# Patient Record
Sex: Male | Born: 2000 | Race: White | Hispanic: No | Marital: Single | Smoking: Never smoker
Health system: Southern US, Community
[De-identification: ages and names within clinical notes are randomized; demographics above are authoritative.]

## PROBLEM LIST (undated history)

## (undated) ENCOUNTER — Ambulatory Visit: Admission: EM | Payer: BC Managed Care – PPO | Source: Home / Self Care

## (undated) DIAGNOSIS — R519 Headache, unspecified: Secondary | ICD-10-CM

## (undated) HISTORY — PX: APPENDECTOMY: SHX54

## (undated) HISTORY — PX: TONSILLECTOMY: SUR1361

## (undated) HISTORY — PX: WISDOM TOOTH EXTRACTION: SHX21

## (undated) HISTORY — PX: SINOSCOPY: SHX187

---
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
## 2018-07-21 DIAGNOSIS — G43909 Migraine, unspecified, not intractable, without status migrainosus: Secondary | ICD-10-CM | POA: Insufficient documentation

## 2019-06-08 NOTE — Discharge Instructions (Signed)
[facility] °[facility] °ENDOSCOPIC SINUS SURGERY °[facility] ° °What is Functional Endoscopic Sinus Surgery? ° The Surgery involves making the natural openings of the sinuses larger by removing the bony partitions that separate the sinuses from the nasal cavity.  The natural sinus lining is preserved as much as possible to allow the sinuses to resume normal function after the surgery.  In some patients nasal polyps (excessively swollen lining of the sinuses) may be removed to relieve obstruction of the sinus openings.  The surgery is performed through the nose using lighted scopes, which eliminates the need for incisions on the face.  A septoplasty is a different procedure which is sometimes performed with sinus surgery.  It involves straightening the boy partition that separates the two sides of your nose.  A crooked or deviated septum may need repair if is obstructing the sinuses or nasal airflow.  Turbinate reduction is also often performed during sinus surgery.  The turbinates are bony proturberances from the side walls of the nose which swell and can obstruct the nose in patients with sinus and allergy problems.  Their size can be surgically reduced to help relieve nasal obstruction. ° °What Can Sinus Surgery Do For Me? ° Sinus surgery can reduce the frequency of sinus infections requiring antibiotic treatment.  This can provide improvement in nasal congestion, post-nasal drainage, facial pressure and nasal obstruction.  Surgery will NOT prevent you from ever having an infection again, so it usually only for patients who get infections 4 or more times yearly requiring antibiotics, or for infections that do not clear with antibiotics.  It will not cure nasal allergies, so patients with allergies may still require medication to treat their allergies after surgery. Surgery may improve headaches related to sinusitis, however, some people will continue to  require medication to control sinus headaches related to allergies.  Surgery will do nothing for other forms of headache (migraine, tension or cluster). ° °What Are the Risks of Endoscopic Sinus Surgery? ° Current techniques allow surgery to be performed safely with little risk, however, there are rare complications that patients should be aware of.  Because the sinuses are located around the eyes, there is risk of eye injury, including blindness, though again, this would be quite rare. This is usually a result of bleeding behind the eye during surgery, which puts the vision oat risk, though there are treatments to protect the vision and prevent permanent disrupted by surgery causing a leak of the spinal fluid that surrounds the brain.  More serious complications would include bleeding inside the brain cavity or damage to the brain.  Again, all of these complications are uncommon, and spinal fluid leaks can be safely managed surgically if they occur.  The most common complication of sinus surgery is bleeding from the nose, which may require packing or cauterization of the nose.  Continued sinus have polyps may experience recurrence of the polyps requiring revision surgery.  Alterations of sense of smell or injury to the tear ducts are also rare complications.  ° °What is the Surgery Like, and what is the Recovery? ° The Surgery usually takes a couple of hours to perform, and is usually performed under a general anesthetic (completely asleep).  Patients are usually discharged home after a couple of hours.  Sometimes during surgery it is necessary to pack the nose to control bleeding, and the packing is left in place for 24 - 48 hours, and removed by your surgeon.    If a septoplasty was performed during the procedure, there is often a splint placed which must be removed after 5-7 days.   °Discomfort: Pain is usually mild to moderate, and can be controlled by prescription pain medication or acetaminophen (Tylenol).   Aspirin, Ibuprofen (Advil, Motrin), or Naprosyn (Aleve) should be avoided, as they can cause increased bleeding.  Most patients feel sinus pressure like they have a bad head cold for several days.  Sleeping with your head elevated can help reduce swelling and facial pressure, as can ice packs over the face.  A humidifier may be helpful to keep the mucous and blood from drying in the nose.  ° °Diet: There are no specific diet restrictions, however, you should generally start with clear liquids and a light diet of bland foods because the anesthetic can cause some nausea.  Advance your diet depending on how your stomach feels.  Taking your pain medication with food will often help reduce stomach upset which pain medications can cause. ° °Nasal Saline Irrigation: It is important to remove blood clots and dried mucous from the nose as it is healing.  This is done by having you irrigate the nose at least 3 - 4 times daily with a salt water solution.  We recommend using NeilMed Sinus Rinse (available at the drug store).  Fill the squeeze bottle with the solution, bend over a sink, and insert the tip of the squeeze bottle into the nose ½ of an inch.  Point the tip of the squeeze bottle towards the inside corner of the eye on the same side your irrigating.  Squeeze the bottle and gently irrigate the nose.  If you bend forward as you do this, most of the fluid will flow back out of the nose, instead of down your throat.   The solution should be warm, near body temperature, when you irrigate.   Each time you irrigate, you should use a full squeeze bottle.  ° °Note that if you are instructed to use Nasal Steroid Sprays at any time after your surgery, irrigate with saline BEFORE using the steroid spray, so you do not wash it all out of the nose. °Another product, Nasal Saline Gel (such as AYR Nasal Saline Gel) can be applied in each nostril 3 - 4 times daily to moisture the nose and reduce scabbing or crusting. ° °Bleeding:   Bloody drainage from the nose can be expected for several days, and patients are instructed to irrigate their nose frequently with salt water to help remove mucous and blood clots.  The drainage may be dark red or brown, though some fresh blood may be seen intermittently, especially after irrigation.  Do not blow you nose, as bleeding may occur. If you must sneeze, keep your mouth open to allow air to escape through your mouth. ° °If heavy bleeding occurs: Irrigate the nose with saline to rinse out clots, then spray the nose 3 - 4 times with Afrin Nasal Decongestant Spray.  The spray will constrict the blood vessels to slow bleeding.  Pinch the lower half of your nose shut to apply pressure, and lay down with your head elevated.  Ice packs over the nose may help as well. If bleeding persists despite these measures, you should notify your doctor.  Do not use the Afrin routinely to control nasal congestion after surgery, as it can result in worsening congestion and may affect healing.  ° ° ° °Activity: Return to work varies among patients. Most patients will be   out of work at least 5 - 7 days to recover.  Patient may return to work after they are off of narcotic pain medication, and feeling well enough to perform the functions of their job.  Patients must avoid heavy lifting (over 10 pounds) or strenuous physical for 2 weeks after surgery, so your employer may need to assign you to light duty, or keep you out of work longer if light duty is not possible.  NOTE: you should not drive, operate dangerous machinery, do any mentally demanding tasks or make any important legal or financial decisions while on narcotic pain medication and recovering from the general anesthetic.  °  °Call Your Doctor Immediately if You Have Any of the Following: °1. Bleeding that you cannot control with the above measures °2. Loss of vision, double vision, bulging of the eye or black eyes. °3. Fever over 101 degrees °4. Neck stiffness with  severe headache, fever, nausea and change in mental state. °You are always encourage to call anytime with concerns, however, please call with requests for pain medication refills during office hours. ° °Office Endoscopy: During follow-up visits your doctor will remove any packing or splints that may have been placed and evaluate and clean your sinuses endoscopically.  Topical anesthetic will be used to make this as comfortable as possible, though you may want to take your pain medication prior to the visit.  How often this will need to be done varies from patient to patient.  After complete recovery from the surgery, you may need follow-up endoscopy from time to time, particularly if there is concern of recurrent infection or nasal polyps. ° ° °General Anesthesia, Adult, Care After °This sheet gives you information about how to care for yourself after your procedure. Your health care provider may also give you more specific instructions. If you have problems or questions, contact your health care provider. °What can I expect after the procedure? °After the procedure, the following side effects are common: °· Pain or discomfort at the IV site. °· Nausea. °· Vomiting. °· Sore throat. °· Trouble concentrating. °· Feeling cold or chills. °· Weak or tired. °· Sleepiness and fatigue. °· Soreness and body aches. These side effects can affect parts of the body that were not involved in surgery. °Follow these instructions at home: ° °For at least 24 hours after the procedure: °· Have a responsible adult stay with you. It is important to have someone help care for you until you are awake and alert. °· Rest as needed. °· Do not: °? Participate in activities in which you could fall or become injured. °? Drive. °? Use heavy machinery. °? Drink alcohol. °? Take sleeping pills or medicines that cause drowsiness. °? Make important decisions or sign legal documents. °? Take care of children on your own. °Eating and  drinking °· Follow any instructions from your health care provider about eating or drinking restrictions. °· When you feel hungry, start by eating small amounts of foods that are soft and easy to digest (bland), such as toast. Gradually return to your regular diet. °· Drink enough fluid to keep your urine pale yellow. °· If you vomit, rehydrate by drinking water, juice, or clear broth. °General instructions °· If you have sleep apnea, surgery and certain medicines can increase your risk for breathing problems. Follow instructions from your health care provider about wearing your sleep device: °? Anytime you are sleeping, including during daytime naps. °? While taking prescription pain medicines, sleeping medicines, or medicines   that make you drowsy. °· Return to your normal activities as told by your health care provider. Ask your health care provider what activities are safe for you. °· Take over-the-counter and prescription medicines only as told by your health care provider. °· If you smoke, do not smoke without supervision. °· Keep all follow-up visits as told by your health care provider. This is important. °Contact a health care provider if: °· You have nausea or vomiting that does not get better with medicine. °· You cannot eat or drink without vomiting. °· You have pain that does not get better with medicine. °· You are unable to pass urine. °· You develop a skin rash. °· You have a fever. °· You have redness around your IV site that gets worse. °Get help right away if: °· You have difficulty breathing. °· You have chest pain. °· You have blood in your urine or stool, or you vomit blood. °Summary °· After the procedure, it is common to have a sore throat or nausea. It is also common to feel tired. °· Have a responsible adult stay with you for the first 24 hours after general anesthesia. It is important to have someone help care for you until you are awake and alert. °· When you feel hungry, start by eating  small amounts of foods that are soft and easy to digest (bland), such as toast. Gradually return to your regular diet. °· Drink enough fluid to keep your urine pale yellow. °· Return to your normal activities as told by your health care provider. Ask your health care provider what activities are safe for you. °This information is not intended to replace advice given to you by your health care provider. Make sure you discuss any questions you have with your health care provider. °Document Released: 02/08/2001 Document Revised: 11/05/2017 Document Reviewed: 06/18/2017 °Elsevier Patient Education © 2020 [employer] ° °

## 2019-06-12 ENCOUNTER — Other Ambulatory Visit
Admission: RE | Admit: 2019-06-12 | Discharge: 2019-06-12 | Disposition: A | Discharge status: Home / Self Care | Payer: BC Managed Care – PPO | Source: Ambulatory Visit | Attending: Otolaryngology | Admitting: Otolaryngology

## 2019-06-12 ENCOUNTER — Other Ambulatory Visit: Payer: Self-pay

## 2019-06-12 DIAGNOSIS — Z20828 Contact with and (suspected) exposure to other viral communicable diseases: Secondary | ICD-10-CM | POA: Insufficient documentation

## 2019-06-12 LAB — SARS CORONAVIRUS 2 (TAT 6-24 HRS): SARS Coronavirus 2: NEGATIVE

## 2019-06-13 ENCOUNTER — Other Ambulatory Visit: Payer: Self-pay

## 2019-06-13 ENCOUNTER — Encounter: Payer: Self-pay | Admitting: *Deleted

## 2019-06-14 NOTE — Anesthesia Preprocedure Evaluation (Addendum)
Anesthesia Evaluation  Patient identified by MRN, date of birth, ID band Patient awake    Reviewed: Allergy & Precautions, NPO status , Patient's Chart, lab work & pertinent test results  History of Anesthesia Complications Negative for: history of anesthetic complications  Airway Mallampati: I   Neck ROM: Full    Dental no notable dental hx.    Pulmonary neg pulmonary ROS,    Pulmonary exam normal breath sounds clear to auscultation       Cardiovascular Exercise Tolerance: Good negative cardio ROS Normal cardiovascular exam Rhythm:Regular Rate:Normal     Neuro/Psych  Headaches,    GI/Hepatic negative GI ROS, Neg liver ROS,   Endo/Other  negative endocrine ROS  Renal/GU negative Renal ROS     Musculoskeletal   Abdominal   Peds  Hematology negative hematology ROS (+)   Anesthesia Other Findings   Reproductive/Obstetrics                            Anesthesia Physical Anesthesia Plan  ASA: I  Anesthesia Plan: General   Post-op Pain Management:    Induction: Intravenous  PONV Risk Score and Plan: 2 and Dexamethasone and Ondansetron  Airway Management Planned: Oral ETT  Additional Equipment:   Intra-op Plan:   Post-operative Plan: Extubation in OR  Informed Consent: I have reviewed the patients History and Physical, chart, labs and discussed the procedure including the risks, benefits and alternatives for the proposed anesthesia with the patient or authorized representative who has indicated his/her understanding and acceptance.       Plan Discussed with: CRNA  Anesthesia Plan Comments:        Anesthesia Quick Evaluation

## 2019-06-15 ENCOUNTER — Ambulatory Visit
Admission: RE | Admit: 2019-06-15 | Discharge: 2019-06-15 | Disposition: A | Discharge status: Home / Self Care | Payer: BC Managed Care – PPO | Attending: Otolaryngology | Admitting: Otolaryngology

## 2019-06-15 ENCOUNTER — Ambulatory Visit: Payer: BC Managed Care – PPO | Admitting: Anesthesiology

## 2019-06-15 ENCOUNTER — Encounter
Admission: RE | Disposition: A | Discharge status: Home / Self Care | Payer: Self-pay | Source: Home / Self Care | Attending: Otolaryngology

## 2019-06-15 DIAGNOSIS — J3489 Other specified disorders of nose and nasal sinuses: Secondary | ICD-10-CM | POA: Insufficient documentation

## 2019-06-15 DIAGNOSIS — J343 Hypertrophy of nasal turbinates: Secondary | ICD-10-CM | POA: Insufficient documentation

## 2019-06-15 DIAGNOSIS — J342 Deviated nasal septum: Secondary | ICD-10-CM | POA: Diagnosis present

## 2019-06-15 HISTORY — PX: NASAL SEPTOPLASTY W/ TURBINOPLASTY: SHX2070

## 2019-06-15 HISTORY — PX: ENDOSCOPIC CONCHA BULLOSA RESECTION: SHX6395

## 2019-06-15 HISTORY — DX: Headache, unspecified: R51.9

## 2019-06-15 SURGERY — SEPTOPLASTY, NOSE, WITH NASAL TURBINATE REDUCTION
Anesthesia: General | Site: Nose | Laterality: Bilateral

## 2019-06-15 MED ORDER — LIDOCAINE HCL (CARDIAC) PF 100 MG/5ML IV SOSY
PREFILLED_SYRINGE | INTRAVENOUS | Status: DC | PRN
  Administered 2019-06-15: 40 mg via INTRAVENOUS

## 2019-06-15 MED ORDER — LACTATED RINGERS IV SOLN
INTRAVENOUS | Status: DC
  Administered 2019-06-15: 09:00:00 via INTRAVENOUS

## 2019-06-15 MED ORDER — GLYCOPYRROLATE 0.2 MG/ML IJ SOLN
INTRAMUSCULAR | Status: DC | PRN
  Administered 2019-06-15: 0.1 mg via INTRAVENOUS

## 2019-06-15 MED ORDER — SUCCINYLCHOLINE CHLORIDE 20 MG/ML IJ SOLN
INTRAMUSCULAR | Status: DC | PRN
  Administered 2019-06-15: 100 mg via INTRAVENOUS

## 2019-06-15 MED ORDER — LIDOCAINE-EPINEPHRINE 1 %-1:100000 IJ SOLN
INTRAMUSCULAR | Status: DC | PRN
  Administered 2019-06-15: 6 mL

## 2019-06-15 MED ORDER — OXYCODONE HCL 5 MG/5ML PO SOLN: 5.0000 mg | Freq: Once | ORAL | Status: AC | PRN

## 2019-06-15 MED ORDER — DEXTROSE 5 % IV SOLN: 2000.0000 mg | Freq: Once | INTRAVENOUS | Status: DC

## 2019-06-15 MED ORDER — PREDNISONE 10 MG PO TABS: ORAL_TABLET | ORAL | 0 refills | Status: DC

## 2019-06-15 MED ORDER — HYDROCODONE-ACETAMINOPHEN 5-325 MG PO TABS: 1.0000 | ORAL_TABLET | Freq: Four times a day (QID) | ORAL | 0 refills | Status: AC | PRN

## 2019-06-15 MED ORDER — ACETAMINOPHEN 10 MG/ML IV SOLN
1000.0000 mg | Freq: Once | INTRAVENOUS | Status: AC
  Administered 2019-06-15: 09:00:00 1000 mg via INTRAVENOUS

## 2019-06-15 MED ORDER — PROPOFOL 10 MG/ML IV BOLUS
INTRAVENOUS | Status: DC | PRN
  Administered 2019-06-15: 150 mg via INTRAVENOUS

## 2019-06-15 MED ORDER — FENTANYL CITRATE (PF) 100 MCG/2ML IJ SOLN
INTRAMUSCULAR | Status: DC | PRN
  Administered 2019-06-15: 100 ug via INTRAVENOUS

## 2019-06-15 MED ORDER — MIDAZOLAM HCL 5 MG/5ML IJ SOLN
INTRAMUSCULAR | Status: DC | PRN
  Administered 2019-06-15: 2 mg via INTRAVENOUS

## 2019-06-15 MED ORDER — CEPHALEXIN 500 MG PO CAPS: 500.0000 mg | ORAL_CAPSULE | Freq: Two times a day (BID) | ORAL | 0 refills | Status: DC

## 2019-06-15 MED ORDER — DEXAMETHASONE SODIUM PHOSPHATE 4 MG/ML IJ SOLN
INTRAMUSCULAR | Status: DC | PRN
  Administered 2019-06-15: 10 mg via INTRAVENOUS

## 2019-06-15 MED ORDER — PHENYLEPHRINE HCL 0.5 % NA SOLN
NASAL | Status: DC | PRN
  Administered 2019-06-15: 30 mL via TOPICAL

## 2019-06-15 MED ORDER — OXYMETAZOLINE HCL 0.05 % NA SOLN
2.0000 | Freq: Once | NASAL | Status: AC
  Administered 2019-06-15: 2 via NASAL

## 2019-06-15 MED ORDER — OXYCODONE HCL 5 MG PO TABS
5.0000 mg | ORAL_TABLET | Freq: Once | ORAL | Status: AC | PRN
  Administered 2019-06-15: 5 mg via ORAL

## 2019-06-15 MED ORDER — ONDANSETRON HCL 4 MG/2ML IJ SOLN
INTRAMUSCULAR | Status: DC | PRN
  Administered 2019-06-15: 4 mg via INTRAVENOUS

## 2019-06-15 MED ORDER — FENTANYL CITRATE (PF) 100 MCG/2ML IJ SOLN
25.0000 ug | INTRAMUSCULAR | Status: DC | PRN
  Administered 2019-06-15: 25 ug via INTRAVENOUS

## 2019-06-15 MED ORDER — ONDANSETRON HCL 4 MG/2ML IJ SOLN: 4.0000 mg | Freq: Once | INTRAMUSCULAR | Status: DC | PRN

## 2019-06-15 MED ORDER — LACTATED RINGERS IV SOLN: INTRAVENOUS | Status: DC

## 2019-06-15 SURGICAL SUPPLY — 32 items
CANISTER SUCT 1200ML W/VALVE (MISCELLANEOUS) ×3 IMPLANT
CATH IV 18X1 1/4 SAFELET (CATHETERS) ×3 IMPLANT
COAGULATOR SUCT 8FR VV (MISCELLANEOUS) ×3 IMPLANT
DRAPE HEAD BAR (DRAPES) ×3 IMPLANT
ELECT REM PT RETURN 9FT ADLT (ELECTROSURGICAL) ×3
ELECTRODE REM PT RTRN 9FT ADLT (ELECTROSURGICAL) ×1 IMPLANT
GLOVE PI ULTRA LF STRL 7.5 (GLOVE) ×2 IMPLANT
GLOVE PI ULTRA NON LATEX 7.5 (GLOVE) ×6
GOWN STRL REUS W/ TWL LRG LVL3 (GOWN DISPOSABLE) ×1 IMPLANT
GOWN STRL REUS W/TWL LRG LVL3 (GOWN DISPOSABLE) ×2
IV CATH 18X1 1/4 SAFELET (CATHETERS) ×1
KIT TURNOVER KIT A (KITS) ×3 IMPLANT
NDL ANESTHESIA 27G X 3.5 (NEEDLE) ×1 IMPLANT
NDL HYPO 27GX1-1/4 (NEEDLE) ×1 IMPLANT
NEEDLE ANESTHESIA  27G X 3.5 (NEEDLE) ×2
NEEDLE ANESTHESIA 27G X 3.5 (NEEDLE) ×1 IMPLANT
NEEDLE HYPO 27GX1-1/4 (NEEDLE) ×3 IMPLANT
PACK ENT CUSTOM (PACKS) ×3 IMPLANT
PACKING NASAL EPIS 4X2.4 XEROG (MISCELLANEOUS) ×2 IMPLANT
PATTIES SURGICAL .5 X3 (DISPOSABLE) ×3 IMPLANT
SOL ANTI-FOG 6CC FOG-OUT (MISCELLANEOUS) ×1 IMPLANT
SOL FOG-OUT ANTI-FOG 6CC (MISCELLANEOUS) ×2
SPLINT NASAL SEPTAL BLV .50 ST (MISCELLANEOUS) ×3 IMPLANT
STRAP BODY AND KNEE 60X3 (MISCELLANEOUS) ×6 IMPLANT
SUT CHROMIC 3-0 (SUTURE) ×2
SUT CHROMIC 3-0 KS 27XMFL CR (SUTURE) ×1
SUT ETHILON 3-0 KS 30 BLK (SUTURE) ×3 IMPLANT
SUT PLAIN GUT 4-0 (SUTURE) ×3 IMPLANT
SUTURE CHRMC 3-0 KS 27XMFL CR (SUTURE) ×1 IMPLANT
SYR 3ML LL SCALE MARK (SYRINGE) ×3 IMPLANT
TOWEL OR 17X26 4PK STRL BLUE (TOWEL DISPOSABLE) ×3 IMPLANT
WATER STERILE IRR 250ML POUR (IV SOLUTION) ×3 IMPLANT

## 2019-06-15 NOTE — H&P (Signed)
H&P has been reviewed and patient reevaluated, no changes necessary. To be downloaded later.  

## 2019-06-15 NOTE — Transfer of Care (Signed)
Immediate Anesthesia Transfer of Care Note  Patient: Melvin Kelly  Procedure(s) Performed: NASAL SEPTOPLASTY WITH INFERIOR TURBINATE REDUCTION (Bilateral Nose) ENDOSCOPIC CONCHA BULLOSA RESECTION (Bilateral Nose)  Patient Location: PACU  Anesthesia Type: General  Level of Consciousness: awake, alert  and patient cooperative  Airway and Oxygen Therapy: Patient Spontanous Breathing and Patient connected to supplemental oxygen  Post-op Assessment: Post-op Vital signs reviewed, Patient's Cardiovascular Status Stable, Respiratory Function Stable, Patent Airway and No signs of Nausea or vomiting  Post-op Vital Signs: Reviewed and stable  Complications: No apparent anesthesia complications

## 2019-06-15 NOTE — Anesthesia Procedure Notes (Signed)
Procedure Name: Intubation Date/Time: 06/15/2019 10:00 AM Performed by: Mayme Genta, CRNA Pre-anesthesia Checklist: Patient identified, Emergency Drugs available, Suction available, Patient being monitored and Timeout performed Patient Re-evaluated:Patient Re-evaluated prior to induction Oxygen Delivery Method: Circle system utilized Preoxygenation: Pre-oxygenation with 100% oxygen Induction Type: IV induction Ventilation: Mask ventilation without difficulty Laryngoscope Size: Miller and 3 Grade View: Grade I Tube type: Oral Rae Tube size: 7.5 mm Number of attempts: 1 Placement Confirmation: ETT inserted through vocal cords under direct vision,  positive ETCO2 and breath sounds checked- equal and bilateral Tube secured with: Tape Dental Injury: Teeth and Oropharynx as per pre-operative assessment

## 2019-06-15 NOTE — Anesthesia Postprocedure Evaluation (Signed)
Anesthesia Post Note  Patient: Melvin Kelly  Procedure(s) Performed: NASAL SEPTOPLASTY WITH INFERIOR TURBINATE REDUCTION (Bilateral Nose) ENDOSCOPIC CONCHA BULLOSA RESECTION (Bilateral Nose)  Patient location during evaluation: PACU Anesthesia Type: General Level of consciousness: awake and alert, oriented and patient cooperative Pain management: pain level controlled Vital Signs Assessment: post-procedure vital signs reviewed and stable Respiratory status: spontaneous breathing, nonlabored ventilation and respiratory function stable Cardiovascular status: blood pressure returned to baseline and stable Postop Assessment: adequate PO intake Anesthetic complications: no    Darrin Nipper

## 2019-06-15 NOTE — Op Note (Signed)
06/15/2019  11:10 AM  [id number]   Pre-Op Dx:  Deviated Nasal Septum, Hypertrophic Inferior Turbinates, concha bullosa of middle turbinates  Post-op Dx: Same  Proc: Nasal Septoplasty, Bilateral Partial Reduction Inferior Turbinates, endoscopic trimming of the concha bullosa of the middle turbinates  Surg:  Elon Alas Rema Lievanos  Anes:  GOT  EBL: 50 mL  Comp: None  Findings: Septum mostly deviated to the right side.  Enlarged inferior turbinates.  Concha bullosa superiorly on both sides of the middle turbinates    Procedure: With the patient in a comfortable supine position,  general orotracheal anesthesia was induced without difficulty.     The patient received preoperative Afrin spray for topical decongestion and vasoconstriction.  Intravenous prophylactic antibiotics were administered.  At an appropriate level, the patient was placed in a semi-sitting position.  Nasal vibrissae were trimmed.   1% Xylocaine with 1:100,000 epinephrine, 6 cc's, was infiltrated into the anterior floor of the nose, into the nasal spine region, into the membranous columella, and finally into the submucoperichondrial plane of the septum on both sides.  Several minutes were allowed for this to take effect.  Cottoniod pledgetts soaked in Afrin and 4% Xylocaine were placed into both nasal cavities and left while the patient was prepped and draped in the standard fashion.  The materials were removed from the nose and observed to be intact and correct in number.  The nose was inspected with a headlight and zero degree scope with the findings as described above.  A left Killian incision was sharply executed and carried down to the quadrangular cartilage. The mucoperichondrium was elelvated along the quadrangular plate back to the bony-cartilaginous junction. The mucoperiostium was then elevated along the ethmoid plate and the vomer. The boney-catilaginous junction was then split with a freer elevator and the  mucoperiosteum was elevated on the opposite side. The mucoperiosteum was then elevated along the maxillary crest as needed to expose the crooked bone of the crest.  Boney spurs of the vomer and maxillary crest were removed with Donavan Foil forceps.  The cartilaginous plate was trimmed along its posterior and inferior borders of about 2 mm of cartilage to free it up inferiorly. Some of the deviated ethmoid plate was then fractured and removed with Takahashi forceps to free up the posterior border of the quadrangular plate and allow it to swing back to the midline. The mucosal flaps were placed back into their anatomic position to allow visualization of the airways. The septum now sat in the midline with an improved airway.  A 3-0 Chromic suture on a Keith needle in used to anchor the inferior septum at the nasal spine with a through and through suture. The mucosal flaps are then sutured together using a through and through whip stitch of 4-0 Plain Gut with a mini-Keith needle. This was used to close the Hydro incision as well.   The inferior turbinates were then inspected. An incision was created along the inferior aspect of the left inferior turbinate with removal of some of the inferior soft tissue and bone. Electrocautery was used to control bleeding in the area. The remaining turbinate was then outfractured to open up the airway further. There was no significant bleeding noted. The right turbinate was then trimmed and outfractured in a similar fashion.  0 degrees scope was used to visualize the left middle meatus.  The middle turbinate was infractured.  There was attachment medially the sinuses where the concha bullosa was near the midportion of the middle turbinate.  Using a freer elevator this was opened and using 45 degree through biting forceps the medial wall of this was trimmed and opened.  Bleeding was controlled with a cotton pledget soaked in phenylephrine and Xylocaine.  The right side was then  visualized with the 0 degrees scope.  The middle turbinate was infractured again and the concha bullosa was found using a Therapist, nutritionalreer elevator.  Small through biting forceps were used to widen this and removed the lateral wall of the concha bullosa.  Cottonoid pledgets were placed here again for vasoconstriction.  On the left side the cottonoid pledget was removed and the opening into the concha bullosa could be now seen.  The middle meatus was more open as well.  A xerogel piece was placed into the middle meatus to cover over the opening to prevent scabbing and scarring here.  This was soaked with water to liquefy it.  The right side was then visualized and the cottonoid pledget removed.  The open the concha bullosa could be seen.  Xerogel was placed here and wetted as well to liquefy it.  The airways were then visualized and showed open passageways on both sides that were significantly improved compared to before surgery. There was no signifcant bleeding. Nasal splints were applied to both sides of the septum using Xomed 0.355mm regular sized splints that were trimmed, and then held in position with a 3-0 Nylon through and through suture.  The patient was turned back over to anesthesia, and awakened, extubated, and taken to the PACU in satisfactory condition.  Dispo:   PACU to home  Plan: Ice, elevation, narcotic analgesia, steroid taper, and prophylactic antibiotics for the duration of indwelling nasal foreign bodies.  We will reevaluate the patient in the office in 6 days and remove the septal splints.  Return to work in 10 days, strenuous activities in two weeks.   Beverly Sessionsaul H Maceo Hernan 06/15/2019 11:10 AM

## 2019-06-16 ENCOUNTER — Encounter: Payer: Self-pay | Admitting: Otolaryngology

## 2020-04-14 ENCOUNTER — Other Ambulatory Visit: Payer: Self-pay

## 2020-04-14 ENCOUNTER — Encounter: Payer: Self-pay | Admitting: Emergency Medicine

## 2020-04-14 ENCOUNTER — Telehealth: Payer: Self-pay | Admitting: Urgent Care

## 2020-04-14 ENCOUNTER — Ambulatory Visit
Admit: 2020-04-14 | Discharge: 2020-04-14 | Disposition: A | Discharge status: Home / Self Care | Payer: BC Managed Care – PPO | Attending: Urgent Care | Admitting: Urgent Care

## 2020-04-14 ENCOUNTER — Ambulatory Visit
Admission: EM | Admit: 2020-04-14 | Discharge: 2020-04-14 | Disposition: A | Discharge status: Home / Self Care | Payer: BC Managed Care – PPO | Attending: Urgent Care | Admitting: Urgent Care

## 2020-04-14 DIAGNOSIS — N50812 Left testicular pain: Secondary | ICD-10-CM

## 2020-04-14 DIAGNOSIS — Z0189 Encounter for other specified special examinations: Secondary | ICD-10-CM

## 2020-04-14 DIAGNOSIS — Z113 Encounter for screening for infections with a predominantly sexual mode of transmission: Secondary | ICD-10-CM | POA: Diagnosis present

## 2020-04-14 LAB — URINALYSIS, COMPLETE (UACMP) WITH MICROSCOPIC
Bacteria, UA: NONE SEEN
Bilirubin Urine: NEGATIVE
Glucose, UA: NEGATIVE mg/dL
Hgb urine dipstick: NEGATIVE
Ketones, ur: NEGATIVE mg/dL
Leukocytes,Ua: NEGATIVE
Nitrite: NEGATIVE
Protein, ur: NEGATIVE mg/dL
Specific Gravity, Urine: 1.02 (ref 1.005–1.030)
Squamous Epithelial / HPF: NONE SEEN (ref 0–5)
pH: 7 (ref 5.0–8.0)

## 2020-04-14 LAB — CHLAMYDIA/NGC RT PCR (ARMC ONLY)
Chlamydia Tr: NOT DETECTED
N gonorrhoeae: NOT DETECTED

## 2020-04-14 NOTE — Telephone Encounter (Signed)
    Date: 04/14/2020  Name: Melvin Kelly DOB: 01/16/2001 MRN: [id number]  Re: [place] results and continuing POC  Patient contacted by me to review US imaging results. Questions fielded. Discussed essentially negative imaging and incidental finding of a 4 mm LEFT "scrotal pearl". I have spoken with urology today and they have recommended having patient seen in the office. Patient amenable. Referral entered. Urology practice to contact patient to schedule. Patient appreciative on communication. Reassurance provided.   Orders Placed This Encounter  Procedures  . Ambulatory referral to Urology    Referral Priority:   Routine    Referral Type:   Consultation    Referral Reason:   Specialty Services Required    Referred to Provider:   Sondra Come, MD    Requested Specialty:   Urology    Number of Visits Requested:   1   Quentin Mulling, MSN, APRN, FNP-C, CEN Advanced Practice Provider [facility] 04/14/2020 4:21 PM

## 2020-04-14 NOTE — Discharge Instructions (Addendum)
Lab testing pending. Will call you if anything results as positive.   Go to [facility] now for an ultrasound. You will check in at the medical mall (enter by the big statue). You can leave after the exam and I will call you with the results and plans.   Quentin Mulling, MSN, APRN, FNP-C, CEN Advanced Practice Provider [facility] 04/14/2020 1:56 PM

## 2020-04-14 NOTE — ED Provider Notes (Signed)
[place]   Name: Melvin Kelly DOB: 07-Nov-2001 MRN: [id number] CSN: [id number] PCP: Patient, No Pcp Per  Arrival date and time:  04/14/20 1321  Chief Complaint:  Testicle Pain (left)  NOTE: Prior to seeing the patient today, I have reviewed the triage nursing documentation and vital signs. Clinical staff has updated patient's PMH/PSHx, current medication list, and drug allergies/intolerances to ensure comprehensive history available to assist in medical decision making.   History:   HPI: Melvin Kelly is a 19 y.o. male who presents today with complaints of pain in his LEFT testicle that has been intermittently present for the last 4-6 months. Patient notes that the pain worsened about 2 days ago. He describes the pain as a "pressure" or "pulling" sensation behind his testicle. He denies any scrotal pain or swelling. No urinary symptoms; no dysuria, frequency, or urgency. He has not had any pain in his penis, bleeding from his penis, or penile discharge. Patient states, "the pain is worse when my testicles are jostled". He reports that 2 days ago he stepped down from a higher elevation causing his scrotum to "flop around", which in turn caused significant pain in the testicle. Patient reporting a bi-directional urinary stream. No gross hematuria. He reports being able to palpate something attached to the testicle. Patient reporting that has sexual activity was "last year", at which time he endorses unprotected sexual intercourse with a single partner. Patient was concerned with possibility of STI, which prompted him to be seen at the health department where he had RPR and HIV testing performed; results negative. Patient advising that "due to COVID" the health department would not perform gonorrhea or chlamydia testing. He reports that he has had gonorrhea and chlamydia testing in the past at [facility], however he believes it was before his current symptoms started; records not  available for review at the time of his clinic visit today.    Past Medical History:  Diagnosis Date  . Headache    sinus(?) - 3-4x/week    Past Surgical History:  Procedure Laterality Date  . APPENDECTOMY     age 18  . 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 NASAL SEPTOPLASTY W/ TURBINOPLASTY Bilateral 06/15/2019   Procedure: NASAL SEPTOPLASTY WITH INFERIOR TURBINATE REDUCTION;  Surgeon: [name], MD;  Location: [facility];  Service: ENT;  Laterality: Bilateral;  . WISDOM TOOTH EXTRACTION     age 64    Family History  Problem Relation Age of Onset  . Healthy Mother   . Healthy Father     Social History   Tobacco Use  . Smoking status: Never Smoker  . Smokeless tobacco: Never Used  Substance Use Topics  . Alcohol use: No  . Drug use: Never    There are no problems to display for this patient.   Home Medications:    Current Meds  Medication Sig  . triamcinolone (NASACORT ALLERGY 24HR) 55 MCG/ACT AERO nasal inhaler Place 2 sprays into the nose daily.    Allergies:   Patient has no known allergies.  Review of Systems (ROS):  Review of systems NEGATIVE unless otherwise noted in narrative H&P section.   Vital Signs: Today's Vitals   04/14/20 1335 04/14/20 1337 04/14/20 1400  BP:  129/65   Pulse:  71   Resp:  15   Temp:  98.1 F (  36.7 C)   TempSrc:  Oral   SpO2:  100%   Weight: 150 lb (68 kg)    Height: 6\' 3"  (1.905 m)    PainSc: 5   5     Physical Exam: Physical Exam  Constitutional: He is oriented to person, place, and time and well-developed, well-nourished, and in no distress.  HENT:  Head: Normocephalic and atraumatic.  Eyes: Pupils are equal, round, and reactive to light.  Cardiovascular: Normal rate, regular rhythm, normal heart sounds and intact distal pulses.    Pulmonary/Chest: Effort normal and breath sounds normal.  Abdominal: Soft. Normal appearance and bowel sounds are normal. He exhibits no distension. There is no abdominal tenderness.  Genitourinary: He exhibits testicular tenderness (LEFT). He exhibits no epididymal tenderness. Abnormal scrotal mass: small extra-testicular area of firmness/mass to LEFT scrotum; moveable and adjacent to testicle; non-tender. Cremasteric reflex is present. Penis exhibits no lesions and no edema. No discharge found.    Genitourinary Comments: (-) blue dot and Phren's signs. Small extra-testicular area of firmness/mass to LEFT scrotum; moveable and adjacent to testicle; non-tender   Neurological: He is alert and oriented to person, place, and time. Gait normal.  Skin: Skin is warm and dry. No rash noted. He is not diaphoretic.  Psychiatric: Memory, affect and judgment normal. His mood appears anxious.  Nursing note and vitals reviewed.   Urgent Care Treatments / Results:   Orders Placed This Encounter  Procedures  . Chlamydia/NGC rt PCR ([facility] only)  . SCROTUM W/DOPPLER  . Urinalysis, Complete w Microscopic    LABS: PLEASE NOTE: all labs that were ordered this encounter are listed, however only abnormal results are displayed. Labs Reviewed  URINALYSIS, COMPLETE (UACMP) WITH MICROSCOPIC - Abnormal; Notable for the following components:      Result Value   Color, Urine STRAW (*)    All other components within normal limits  CHLAMYDIA/NGC RT PCR [facility] ONLY)    EKG: -None  RADIOLOGY: [facility] SCROTUM W/DOPPLER  Result Date: 04/14/2020 CLINICAL DATA:  19 year old male with left sided testicular pain. EXAM: SCROTAL ULTRASOUND DOPPLER ULTRASOUND OF THE TESTICLES TECHNIQUE: Complete ultrasound examination of the testicles, epididymis, and other scrotal structures was performed. Color and spectral Doppler ultrasound were also utilized to evaluate blood flow to the testicles. COMPARISON:  None. FINDINGS:  Right testicle Measurements: 5.6 x 2.3 x 3.5 cm. No mass or microlithiasis visualized. Left testicle Measurements: 5.3 x 2.2 x 3.2 cm. No mass or microlithiasis visualized. Right epididymis:  Normal in size and appearance. Left epididymis:  Normal in size and appearance. Hydrocele:  None visualized. Varicocele:  None visualized.  There is a 4 mm left scrotal pearl. Pulsed Doppler interrogation of both testes demonstrates normal low resistance arterial and venous waveforms bilaterally. IMPRESSION: A 4 mm left scrotal pearl, otherwise unremarkable testicular ultrasound. Electronically Signed   By: 26 M.D.   On: 04/14/2020 15:16    PROCEDURES: Procedures  MEDICATIONS RECEIVED THIS VISIT: Medications - No data to display  PERTINENT CLINICAL COURSE NOTES/UPDATES:   Initial Impression / Assessment and Plan / Urgent Care Course:  Pertinent labs & imaging results that were available during my care of the patient were personally reviewed by me and considered in my medical decision making (see lab/imaging section of note for values and interpretations).  Melvin Kelly is a 19 y.o. male who presents to [facility] today with complaints of Testicle Pain (left)  Patient is well appearing overall in clinic today. He does not  appear to be in any acute distress. Presenting symptoms (see HPI) and exam as documented above. Exam reveals LEFT testicular tenderness. No immediate concern for torsion given the chronicity of symptoms and (-) blue dot sign. Suspect more benign etiology such as hydrocele, varicocele, or epididymitis. UA today negative for infection; 0-5 WBC/hpf, 0-5 RBC/hpf, and no nitrites, LE, or bacteria. Sending random urine for GC. Given chronicity of symptoms and exam patient needs further evaluation (imaging). Discussed with patient that US imaging not available in the UC setting today, however in efforts to prevent a visit to the emergency department, I would be willing to order  the study as an outpatient at the hospital. Patient willing to travel to the hospital for the study today; order placed and [place] department notified. Will plan on calling patient with the results once exam completed and interpreted by the radiologist today. Patient verbalizes understanding and consent with the POC as discussed with him.   ADDENDUM: [place] of scrotum/testicles reviewed. Study was remarkable for a 4 mm LEFT scrotal pearl only. There were no other acute findings. Discussed presentation and US findings with Dr. Jeffie Pollock (urology) via [facility]. MD advised that this sonographic finding should not be causing the pain that the patient is experiencing. MD recommended having patient seen in consult by urology this week. Referral entered. Patient notified of results; questions fielded. He was advised to expect call from urology practice to schedule appointment for him to be seen by urology specialist.   Final Clinical Impressions / Urgent Care Diagnoses:   Final diagnoses:  Left testicular pain  Encounter for urine test  Screen for STD (sexually transmitted disease)    New Prescriptions:  [place] Controlled Substance Registry consulted? Not Applicable  No orders of the defined types were placed in this encounter.   Recommended Follow up Care:  Patient encouraged to follow up with the following provider within the specified time frame, or sooner as dictated by the severity of his symptoms. As always, he was instructed that for any urgent/emergent care needs, he should seek care either here or in the emergency department for more immediate evaluation.  Follow-up Information    Go to  [facility] [facility].   Why: Scheduled you for an outpatient ultrasound today. Leave urgent care and proceed to [facility] for ultrasound.       Abbie Sons, MD.   Specialty: Urology Why: Need to be seen for further evaluation. They should call you to schedule an appointment. Contact information: [address] [place] [address] [phone]         NOTE: This note was prepared using Dragon dictation software along with smaller phrase technology. Despite my best ability to proofread, there is the potential that transcriptional errors may still occur from this process, and are completely unintentional.    Karen Kitchens, NP 04/14/20 1617

## 2020-04-14 NOTE — ED Triage Notes (Signed)
Patient c/o left testicle pain that couple of days ago.  Patient denies redness or swelling.  Patient denies injury.  Patient denies penile discharge.

## 2020-04-23 ENCOUNTER — Other Ambulatory Visit: Payer: Self-pay

## 2020-04-23 ENCOUNTER — Ambulatory Visit (INDEPENDENT_AMBULATORY_CARE_PROVIDER_SITE_OTHER): Payer: BC Managed Care – PPO | Admitting: Urology

## 2020-04-23 ENCOUNTER — Encounter: Payer: Self-pay | Admitting: Urology

## 2020-04-23 VITALS — BP 123/79 | HR 62 | Ht 74.0 in | Wt 155.0 lb

## 2020-04-23 DIAGNOSIS — N5082 Scrotal pain: Secondary | ICD-10-CM | POA: Diagnosis not present

## 2020-04-23 MED ORDER — CELECOXIB 100 MG PO CAPS: 100.0000 mg | ORAL_CAPSULE | Freq: Every day | ORAL | 0 refills | Status: DC

## 2020-04-23 NOTE — Progress Notes (Signed)
   04/23/20 8:12 AM   [name] 31-Jan-2001 [id number]  CC: Scrotal pain  HPI: I saw [name] in urology clinic today for evaluation of left scrotal pain.  He is a healthy 19 year old male who reports 6 months of intermittent left-sided scrotal pain.  He denies any gross hematuria, history of UTI, or history of STD.  He was seen recently in the urgent care on 04/14/2020 where urinalysis was completely benign, STD testing was negative, and scrotal ultrasound showed no abnormalities.  He reports occasional split urinary stream but denies any other urinary symptoms.  He has not been sexually active in the last year.  His scrotal pain is worse with strenuous activity or jumping.   PMH: Past Medical History:  Diagnosis Date  . Headache    sinus(?) - 3-4x/week    Surgical History: Past Surgical History:  Procedure Laterality Date  . APPENDECTOMY     age 67  . ENDOSCOPIC CONCHA BULLOSA RESECTION Bilateral 06/15/2019   Procedure: ENDOSCOPIC CONCHA BULLOSA RESECTION;  Surgeon: [name], MD;  Location: [facility];  Service: ENT;  Laterality: Bilateral;  . NASAL SEPTOPLASTY W/ TURBINOPLASTY Bilateral 06/15/2019   Procedure: NASAL SEPTOPLASTY WITH INFERIOR TURBINATE REDUCTION;  Surgeon: [name], MD;  Location: [facility];  Service: ENT;  Laterality: Bilateral;  . WISDOM TOOTH EXTRACTION     age 66    Family History: Family History  Problem Relation Age of Onset  . Healthy Mother   . Healthy Father     Social History:  reports that he has never smoked. He has never used smokeless tobacco. He reports that he does not drink alcohol or use drugs.  Physical Exam: There were no vitals taken for this visit.   Constitutional:  Alert and oriented, No acute distress. Cardiovascular: No clubbing, cyanosis, or edema. Respiratory: Normal respiratory effort, no increased work of breathing. GI: Abdomen is soft, nontender, nondistended, no abdominal masses GU:  Circumcised phallus with widely patent meatus, testicles 25 cc and descended bilaterally without masses.  No testicular tenderness on exam.  Laboratory Data: Reviewed, see HPI  Pertinent Imaging: I have personally reviewed the scrotal ultrasound.  No evidence of testicular mass, cysts, or hyperemia worrisome for epididymitis.  Incidental finding of a 4 mm left scrotal pearl.  Assessment & Plan:   In summary, he is a healthy 19 year old male with 6 months of intermittent left scrotal plain with no clear etiology.  Urinalysis, STD testing, and scrotal ultrasound are all negative.  We discussed strategies for scrotal pain including snug fitting underwear, NSAIDs, icing, and rest.  We discussed return precautions at length including worsening urinary symptoms, gross hematuria, or worsening scrotal pain.  Celebrex 100 mg daily x2 weeks, consider amitriptyline at follow-up if persistent scrotal pain Behavioral strategies discussed RTC 1 month for symptom check  [name], MD 04/23/2020  [facility] [address], [address] [place], [place] [address] [phone]

## 2020-04-23 NOTE — Patient Instructions (Signed)
1.  Wear snug fitting underwear, especially with strenuous activity 2. Okay to ice scrotum as needed for moderate to severe pain for 15 to 20 minutes at a time  Pelvic Pain, Male Pelvic pain is pain in your lower abdomen, below your belly button and between your hips. The pain may start suddenly (be acute), keep coming back (recur), or last a long time (become chronic). Pelvic pain that lasts longer than six months is considered chronic. There are many possible causes of pelvic pain. Sometimes, the cause is not known. Pelvic pain may affect your:  Prostate gland.  Urinary system.  Digestive tract.  Musculoskeletal system. Strained muscles or ligaments may cause pelvic pain. Follow these instructions at home:  Medicines  Take over-the-counter and prescription medicines only as told by your health care provider.  If you were prescribed an antibiotic medicine, take it as told by your health care provider. Do not stop taking the antibiotic even if you start to feel better. Managing pain, stiffness, and swelling   Take warm water baths (sitz baths). Sitz baths help with relaxing your pelvic floor muscles. ? For a sitz bath, the water only comes up to your hips and covers your buttocks. A sitz bath may done at home in a bathtub or with a portable sitz bath that fits over the toilet.  If directed, apply heat to the affected area before you exercise. Use the heat source that your health care provider recommends, such as a moist heat pack or a heating pad. ? Place a towel between your skin and the heat source. ? Leave the heat on for 20-30 minutes. ? Remove the heat if your skin turns bright red. This is especially important if you are unable to feel pain, heat, or cold. You may have a greater risk of getting burned. General instructions  Rest as told by your health care provider.  Keep a journal of your pelvic pain. Write down: ? When the pain started. ? Where the pain is  located. ? What seems to make the pain better or worse. ? Any symptoms you have along with the pain.  Follow your treatment plan as told by your health care provider. This may include: ? Pelvic physical therapy. ? Yoga, meditation, and exercise. ? Biofeedback. This process trains you to manage your body's response (physiological response) through breathing techniques and relaxation methods. You will work with a therapist while machines are used to monitor your physical symptoms. ? Acupuncture. This is a type of treatment that involves stimulating specific points on your body by inserting thin needles through your skin to treat pain.  Keep all follow-up visits as told by your health care provider. This is important. Contact a health care provider if:  Medicine does not help your pain.  Your pain comes back.  You have new symptoms.  You have a fever or chills.  You are constipated.  You have blood in your urine or stool.  You feel weak or light-headed. Get help right away if:  You have sudden severe pain.  Your pain steadily gets worse.  You have severe pain along with fever, nausea, vomiting, or excessive sweating. Summary  Pelvic pain is pain in your lower abdomen, below your belly button and between your hips. There are many possible causes of pelvic pain. Sometimes, the cause is not known.  Take over-the-counter and prescription medicines only as told by your health care provider. If you were prescribed an antibiotic medicine, take it as told  by your health care provider. Do not stop taking the antibiotic even if you start to feel better.  Contact a health care provider if you have new or worsening symptoms.  Get help right away if you have severe pain along with fever, nausea, vomiting, or excessive sweating.  Keep all follow-up visits as told by your health care provider. This is important. This information is not intended to replace advice given to you by your health  care provider. Make sure you discuss any questions you have with your health care provider. Document Revised: 03/23/2018 Document Reviewed: 03/23/2018 Elsevier Patient Education  2020 [employer].

## 2020-05-21 ENCOUNTER — Ambulatory Visit: Payer: BC Managed Care – PPO | Admitting: Urology

## 2020-11-12 ENCOUNTER — Other Ambulatory Visit: Payer: Self-pay

## 2020-11-12 ENCOUNTER — Ambulatory Visit
Admission: EM | Admit: 2020-11-12 | Discharge: 2020-11-12 | Disposition: A | Discharge status: Home / Self Care | Payer: BC Managed Care – PPO | Attending: Emergency Medicine | Admitting: Emergency Medicine

## 2020-11-12 DIAGNOSIS — Z113 Encounter for screening for infections with a predominantly sexual mode of transmission: Secondary | ICD-10-CM

## 2020-11-12 DIAGNOSIS — R300 Dysuria: Secondary | ICD-10-CM | POA: Diagnosis not present

## 2020-11-12 LAB — URINALYSIS, COMPLETE (UACMP) WITH MICROSCOPIC
Bilirubin Urine: NEGATIVE
Glucose, UA: NEGATIVE mg/dL
Hgb urine dipstick: NEGATIVE
Ketones, ur: NEGATIVE mg/dL
Leukocytes,Ua: NEGATIVE
Nitrite: NEGATIVE
Protein, ur: NEGATIVE mg/dL
RBC / HPF: NONE SEEN RBC/hpf (ref 0–5)
Specific Gravity, Urine: 1.015 (ref 1.005–1.030)
Squamous Epithelial / HPF: NONE SEEN (ref 0–5)
pH: 7.5 (ref 5.0–8.0)

## 2020-11-12 LAB — CHLAMYDIA/NGC RT PCR (ARMC ONLY)
Chlamydia Tr: DETECTED — AB
N gonorrhoeae: NOT DETECTED

## 2020-11-12 MED ORDER — DOXYCYCLINE HYCLATE 100 MG PO CAPS: 100.0000 mg | ORAL_CAPSULE | Freq: Two times a day (BID) | ORAL | 0 refills | Status: AC

## 2020-11-12 MED ORDER — CEFTRIAXONE SODIUM 500 MG IJ SOLR
500.0000 mg | Freq: Once | INTRAMUSCULAR | Status: AC
  Administered 2020-11-12: 500 mg via INTRAMUSCULAR

## 2020-11-12 NOTE — ED Triage Notes (Signed)
Pt c/o burning with urination, frequency, lower abdominal pain for approx 1 week. Also reports small amount penile discharge onset today.  Also reports n/d and c/o right mid back pain. States he had u/s of testicles in June.  Denies hematuria, fever, vomiting.

## 2020-11-12 NOTE — Discharge Instructions (Addendum)
Take the doxycycline twice daily for 7 days.  Take this medication with food.  Doxycycline will make you more prone to sunburn.  Please make sure you are wearing sunscreen when outdoors or exposed to sunlight.  Your urinalysis today did not reveal the presence of an infection.  Your gonorrhea and Chlamydia swab are still pending.  We will call you when those results are back.  If your symptoms persist, or new symptoms develop, either return for reevaluation or follow-up with your primary care provider.

## 2020-11-12 NOTE — ED Provider Notes (Signed)
[facility]    CSN: [id number] Arrival date & time: 11/12/20  1008      History   Chief Complaint Chief Complaint  Patient presents with  . Dysuria  . Penile Discharge    HPI Melvin Kelly is a 19 y.o. male.   HPI   19 year old male here for evaluation of painful urination and penile discharge.  Patient reports that he is been experiencing foul-smelling urine for the past 2 months and pain with urination intermittently for the past week.  Patient also has urgency and frequency of urination, left low back pain, fatigue, and he developed a yellow penile discharge today.  Patient is sexually active his last sexual encounter was in November it was unprotected and with a male partner.  Patient denies blood in his urine, fever, rashes, or pain in his testicles.  Patient states that he has had some mild lower abdominal pain.  Patient is currently taking prep that he started in July.  His last HIV test was in February and he was negative at that time.  Past Medical History:  Diagnosis Date  . Headache    sinus(?) - 3-4x/week    There are no problems to display for this patient.   Past Surgical History:  Procedure Laterality Date  . APPENDECTOMY     age 49  . 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 NASAL SEPTOPLASTY W/ TURBINOPLASTY Bilateral 06/15/2019   Procedure: NASAL SEPTOPLASTY WITH INFERIOR TURBINATE REDUCTION;  Surgeon: [name], MD;  Location: [facility];  Service: ENT;  Laterality: Bilateral;  . WISDOM TOOTH EXTRACTION     age 19       Home Medications    Prior to Admission medications   Medication Sig Start Date End Date Taking? Authorizing Provider  citalopram (CELEXA) 10 MG tablet Take 10 mg by mouth daily. 10/21/20  Yes [provider]  doxycycline  (VIBRAMYCIN) 100 MG capsule Take 1 capsule (100 mg total) by mouth 2 (two) times daily for 7 days. 11/12/20 11/19/20 Yes Becky Augusta, NP  emtricitabine-tenofovir (TRUVADA) 200-300 MG tablet  10/24/20  Yes [provider]  celecoxib (CELEBREX) 100 MG capsule Take 1 capsule (100 mg total) by mouth daily. 04/23/20   Sondra Come, MD  triamcinolone (NASACORT ALLERGY 24HR) 55 MCG/ACT AERO nasal inhaler Place 2 sprays into the nose daily.    [provider]    Family History Family History  Problem Relation Age of Onset  . Healthy Mother   . Healthy Father     Social History Social History   Tobacco Use  . Smoking status: Never Smoker  . Smokeless tobacco: Never Used  Vaping Use  . Vaping Use: Never used  Substance Use Topics  . Alcohol use: No  . Drug use: Never     Allergies   Patient has no known allergies.   Review of Systems Review of Systems  Constitutional: Negative for activity change, appetite change and fever.  Gastrointestinal: Positive for abdominal pain. Negative for diarrhea, nausea and vomiting.  Genitourinary: Positive for dysuria, frequency, penile discharge and urgency. Negative for hematuria, penile pain, penile swelling and testicular pain.  Musculoskeletal: Positive for back pain.  Skin: Negative for rash.  Hematological: Negative.   Psychiatric/Behavioral: Negative.      Physical Exam Triage Vital Signs ED Triage Vitals [11/12/20 1244]  Enc Vitals Group     BP      Pulse      Resp      Temp      Temp src      SpO2      Weight      Height      Head Circumference      Peak Flow      Pain Score 0     Pain Loc      Pain Edu?      Excl. in GC?    No data found.  Updated Vital Signs BP (!) 124/101 (BP Location: Right Arm)   Pulse 74   Temp 98.2 F (36.8 C) (Oral)   Resp 18   SpO2 100%   Visual Acuity Right Eye Distance:   Left Eye Distance:   Bilateral Distance:    Right Eye Near:   Left Eye Near:    Bilateral  Near:     Physical Exam Vitals and nursing note reviewed.  Constitutional:      General: He is not in acute distress.    Appearance: Normal appearance. He is normal weight. He is not ill-appearing.  HENT:     Head: Normocephalic and atraumatic.  Cardiovascular:     Rate and Rhythm: Normal rate and regular rhythm.     Pulses: Normal pulses.     Heart sounds: Normal heart sounds. No murmur heard. No gallop.   Pulmonary:     Effort: Pulmonary effort is normal.     Breath sounds: Normal breath sounds. No wheezing, rhonchi or rales.  Abdominal:     General: Abdomen is flat. Bowel sounds are normal.     Palpations: Abdomen is soft.     Tenderness: There is abdominal tenderness. There is no right CVA tenderness, left CVA tenderness, guarding or rebound.  Skin:    General: Skin is warm and dry.     Capillary Refill: Capillary refill takes less than 2 seconds.     Findings: No erythema or rash.  Neurological:     General: No focal deficit present.     Mental Status: He is alert and oriented to person, place, and time.  Psychiatric:        Mood and Affect: Mood normal.        Behavior: Behavior normal.        Thought Content: Thought content normal.        Judgment: Judgment normal.      UC Treatments / Results  Labs (all labs ordered are listed, but only abnormal results are displayed) Labs Reviewed  URINALYSIS, COMPLETE (UACMP) WITH MICROSCOPIC - Abnormal; Notable for the following components:      Result Value   Bacteria, UA FEW (*)    All other components within normal limits  CHLAMYDIA/NGC RT PCR [facility] ONLY)    EKG   Radiology No results found.  Procedures Procedures (including critical care time)  Medications Ordered in UC Medications  cefTRIAXone (ROCEPHIN) injection 500 mg (has no administration in time range)    Initial Impression / Assessment and Plan / UC Course  I have reviewed the triage vital signs and the nursing notes.  Pertinent labs &  imaging results that were available during my care of the patient were reviewed by me and considered in my medical decision making (see chart for details).   Nontoxic-appearing gentleman here for evaluation of painful urination and penile discharge.  His painful urination is been on and off  for the past week and his penile discharge started today.  He describes as being yellow in color.  Patient also reports he has had some foul-smelling urine for the past 2 months.  Additionally, patient has some generalized lower abdominal pain.  Physical exam reveals an abdomen that is soft, nondistended, with bowel sounds in all 4 quadrants.  Patient has mild nonfocal lower abdominal tenderness.  No guarding or rebound on exam.  Genital exam deferred.  Patient has no CVA tenderness.  Urine and swab collected for GC chlamydia and also urinalysis.  Urinalysis is positive for few bacteria.  There are no leukocytes, nitrates, WBCs, or protein in the urine.  Gonorrhea chlamydia screen is pending.  We will treat for presumptive STI with 500 Rocephin IM here and 7 days of doxycycline p.o.  This will cover gonorrhea and chlamydia.  Final Clinical Impressions(s) / UC Diagnoses   Final diagnoses:  Dysuria  Routine screening for STI (sexually transmitted infection)     Discharge Instructions     Take the doxycycline twice daily for 7 days.  Take this medication with food.  Doxycycline will make you more prone to sunburn.  Please make sure you are wearing sunscreen when outdoors or exposed to sunlight.  Your urinalysis today did not reveal the presence of an infection.  Your gonorrhea and Chlamydia swab are still pending.  We will call you when those results are back.  If your symptoms persist, or new symptoms develop, either return for reevaluation or follow-up with your primary care provider.    ED Prescriptions    Medication Sig Dispense Auth. Provider   doxycycline (VIBRAMYCIN) 100 MG capsule Take 1 capsule  (100 mg total) by mouth 2 (two) times daily for 7 days. 14 capsule Becky Augusta, NP     PDMP not reviewed this encounter.   Becky Augusta, NP 11/12/20 1335

## 2020-11-21 DIAGNOSIS — Z20822 Contact with and (suspected) exposure to covid-19: Secondary | ICD-10-CM | POA: Diagnosis not present

## 2020-11-21 DIAGNOSIS — U071 COVID-19: Secondary | ICD-10-CM | POA: Diagnosis not present

## 2021-02-21 DIAGNOSIS — J301 Allergic rhinitis due to pollen: Secondary | ICD-10-CM | POA: Diagnosis not present

## 2021-02-21 DIAGNOSIS — J358 Other chronic diseases of tonsils and adenoids: Secondary | ICD-10-CM | POA: Diagnosis not present

## 2021-03-13 ENCOUNTER — Encounter: Payer: Self-pay | Admitting: Otolaryngology

## 2021-03-17 DIAGNOSIS — J358 Other chronic diseases of tonsils and adenoids: Secondary | ICD-10-CM | POA: Diagnosis not present

## 2021-03-24 NOTE — Discharge Instructions (Signed)
T & A INSTRUCTION SHEET - [facility]  PAUL JUENGEL, MD  [address] [place], [place] [address] [place].  [phone]  INFORMATION SHEET FOR A TONSILLECTOMY AND ADENDOIDECTOMY  About Your Tonsils and Adenoids  The tonsils and adenoids are normal body tissues that are part of our immune system.  They normally help to protect us against diseases that may enter our mouth and nose. However, sometimes the tonsils and/or adenoids become too large and obstruct our breathing, especially at night.    If either of these things happen it helps to remove the tonsils and adenoids in order to become healthier. The operation to remove the tonsils and adenoids is called a tonsillectomy and adenoidectomy.  The Location of Your Tonsils and Adenoids  The tonsils are located in the back of the throat on both side and sit in a cradle of muscles. The adenoids are located in the roof of the mouth, behind the nose, and closely associated with the opening of the Eustachian tube to the ear.  Surgery on Tonsils and Adenoids  A tonsillectomy and adenoidectomy is a short operation which takes about thirty minutes.  This includes being put to sleep and being awakened. Tonsillectomies and adenoidectomies are performed at [facility] and may require observation period in the recovery room prior to going home. Children are required to remain in recovery for at least 45 minutes.   Following the Operation for a Tonsillectomy  A cautery machine is used to control bleeding. Bleeding from a tonsillectomy and adenoidectomy is minimal and postoperatively the risk of bleeding is approximately four percent, although this rarely life threatening.  After your tonsillectomy and adenoidectomy post-op care at home: 1. Our patients are able to go home the same day. You may be given prescriptions for pain medications, if indicated. 2. It is extremely important to  remember that fluid intake is of utmost importance after a tonsillectomy. The amount that you drink must be maintained in the postoperative period. A good indication of whether a child is getting enough fluid is whether his/her urine output is constant. As long as children are urinating or wetting their diaper every 6 - 8 hours this is usually enough fluid intake.   3. Although rare, this is a risk of some bleeding in the first ten days after surgery. This usually occurs between day five and nine postoperatively. This risk of bleeding is approximately four percent. If you or your child should have any bleeding you should remain calm and notify our office or go directly to the emergency room at [facility] where they will contact us. Our doctors are available seven days a week for notification. We recommend sitting up quietly in a chair, place an ice pack on the front of the neck and spitting out the blood gently until we are able to contact you. Adults should gargle gently with ice water and this may help stop the bleeding. If the bleeding does not stop after a short time, i.e. 10 to 15 minutes, or seems to be increasing again, please contact us or go to the hospital.   4. It is common for the pain to be worse at 5 - 7 days postoperatively. This occurs because the "scab" is peeling off and the mucous membrane (skin of the throat) is growing back where the tonsils were.   5. It is common for a low-grade fever, less than 102, during the first week   after a tonsillectomy and adenoidectomy. It is usually due to not drinking enough liquids, and we suggest your use liquid Tylenol (acetaminophen) or the pain medicine with Tylenol (acetaminophen) prescribed in order to keep your temperature below 102. Please follow the directions on the back of the bottle. 6. Recommendations for post-operative pain in children and adults: a) For Children 12 and younger: Recommendations are for oral Tylenol  (acetaminophen) and oral Motrin (ibuprofen). Administer the Tylenol (acetaminophen) and Motrin as stated on bottle for patient's age/weight. Sometimes it may be necessary to alternate the Tylenol (acetaminophen) and Motrin for improved pain control. Motrin (ibuprofen) does last slightly longer so many patients benefit from being given this prior to bedtime. All children should avoid Aspirin products for 2 weeks following surgery. b) For children over the age of 12: Tylenol (acetaminophen) is the preferred first choice for pain control. Depending on your child's size, sometimes they will be given a combination of Tylenol (acetaminophen) and hydrocodone medication or sometimes it will be recommended they take Motrin (ibuprofen) in addition to the Tylenol (acetaminophen). Narcotics should always be used with caution in children following surgery as they can suppress their breathing and switching to over the counter Tylenol (acetaminophen) and Motrin (ibuprofen) as soon as possible is recommended. All patients should avoid Aspirin products for 2 weeks following surgery. c) Adults: Usually adults will require a narcotic pain medication following a tonsillectomy. This usually has either hydrocodone or oxycodone in it and can usually be taken every 4 to 6 hours as needed for moderate pain. If the medication does not have Tylenol (acetaminophen) in it, you may also supplement Tylenol (acetaminophen) as needed every 4 to 6 hours for breakthrough or mild pain. Adults should avoid Aspirin, Aleve, Motrin, and Ibuprofen products for 2 weeks following surgery as they can increase your risk of bleeding. 7. If you happen to look in the mirror or into your child's mouth you will see white/gray patches on the back of the throat. This is what a scab looks like in the mouth and is normal after having a tonsillectomy and adenoidectomy. They will disappear once the tonsil areas heal completely. However, it may cause a noticeable odor,  and this too will disappear with time.     8. You or your child may experience ear pain after having a tonsillectomy and adenoidectomy.  This is called referred pain and comes from the throat, but it is felt in the ears.  Ear pain is quite common and expected. It will usually go away after ten days. There is usually nothing wrong with the ears, and it is primarily due to the healing area stimulating the nerve to the ear that runs along the side of the throat. Use either the prescribed pain medicine or Tylenol (acetaminophen) as needed.  9. The throat tissues after a tonsillectomy are obviously sensitive. Smoking around children who have had a tonsillectomy significantly increases the risk of bleeding. DO NOT SMOKE!  What to Expect Each Day  First Day at Home 1. Patients will be discharged home the same day.  2. Drink at least four glasses of liquid a day. Clear, cool liquids are recommended. Fruit juices containing citric acid are not recommended because they tend to cause pain. Carbonated beverages are allowed if you pour them from glass to glass to remove the bubbles as these tend to cause discomfort. Avoid alcoholic beverages.  3. Eat very soft foods such as soups, broth, jello, custard, pudding, ice cream, popsicles, applesauce, mashed potatoes,   and in general anything that you can crush between your tongue and the roof of your mouth. Try adding [employer] Mix into your food for extra calories. It is not uncommon to lose 5 to 10 pounds of fluid weight. The weight will be gained back quickly once you're feeling better and drinking more.  4. Sleep with your head elevated on two pillows for about three days to help decrease the swelling.  5. DO NOT SMOKE!  Day Two  1. Rest as much as possible. Use common sense in your activities.  2. Continue drinking at least four glasses of liquid per day.  3. Follow the soft diet.  4. Use your pain medication as needed.  Day Three  1. Advance  your activity as you are able and continue to follow the previous day's suggestions.  Days Four Through Six  1. Advance your diet and begin to eat more solid foods such as chopped hamburger. 2. Advance your activities slowly. Children should be kept mostly around the house.  3. Not uncommonly, there will be more pain at this time. It is temporary, usually lasting a day or two.  Day Seven Through Ten  1. Most individuals by this time are able to return to work or school unless otherwise instructed. Consider sending children back to school for a half day on the first day back.  General Anesthesia, Adult, Care After This sheet gives you information about how to care for yourself after your procedure. Your health care provider may also give you more specific instructions. If you have problems or questions, contact your health care provider. What can I expect after the procedure? After the procedure, the following side effects are common:  Pain or discomfort at the IV site.  Nausea.  Vomiting.  Sore throat.  Trouble concentrating.  Feeling cold or chills.  Feeling weak or tired.  Sleepiness and fatigue.  Soreness and body aches. These side effects can affect parts of the body that were not involved in surgery. Follow these instructions at home: For the time period you were told by your health care provider:  Rest.  Do not participate in activities where you could fall or become injured.  Do not drive or use machinery.  Do not drink alcohol.  Do not take sleeping pills or medicines that cause drowsiness.  Do not make important decisions or sign legal documents.  Do not take care of children on your own.   Eating and drinking  Follow any instructions from your health care provider about eating or drinking restrictions.  When you feel hungry, start by eating small amounts of foods that are soft and easy to digest (bland), such as toast. Gradually return to your regular  diet.  Drink enough fluid to keep your urine pale yellow.  If you vomit, rehydrate by drinking water, juice, or clear broth. General instructions  If you have sleep apnea, surgery and certain medicines can increase your risk for breathing problems. Follow instructions from your health care provider about wearing your sleep device: ? Anytime you are sleeping, including during daytime naps. ? While taking prescription pain medicines, sleeping medicines, or medicines that make you drowsy.  Have a responsible adult stay with you for the time you are told. It is important to have someone help care for you until you are awake and alert.  Return to your normal activities as told by your health care provider. Ask your health care provider what activities are safe for you.  Take over-the-counter and prescription medicines only as told by your health care provider.  If you smoke, do not smoke without supervision.  Keep all follow-up visits as told by your health care provider. This is important. Contact a health care provider if:  You have nausea or vomiting that does not get better with medicine.  You cannot eat or drink without vomiting.  You have pain that does not get better with medicine.  You are unable to pass urine.  You develop a skin rash.  You have a fever.  You have redness around your IV site that gets worse. Get help right away if:  You have difficulty breathing.  You have chest pain.  You have blood in your urine or stool, or you vomit blood. Summary  After the procedure, it is common to have a sore throat or nausea. It is also common to feel tired.  Have a responsible adult stay with you for the time you are told. It is important to have someone help care for you until you are awake and alert.  When you feel hungry, start by eating small amounts of foods that are soft and easy to digest (bland), such as toast. Gradually return to your regular diet.  Drink  enough fluid to keep your urine pale yellow.  Return to your normal activities as told by your health care provider. Ask your health care provider what activities are safe for you. This information is not intended to replace advice given to you by your health care provider. Make sure you discuss any questions you have with your health care provider. Document Revised: 07/18/2020 Document Reviewed: 02/15/2020 Elsevier Patient Education  2021 [employer].

## 2021-03-25 DIAGNOSIS — B354 Tinea corporis: Secondary | ICD-10-CM | POA: Diagnosis not present

## 2021-03-25 DIAGNOSIS — F419 Anxiety disorder, unspecified: Secondary | ICD-10-CM | POA: Diagnosis not present

## 2021-03-27 ENCOUNTER — Encounter
Admission: RE | Disposition: A | Discharge status: Home / Self Care | Payer: Self-pay | Source: Home / Self Care | Attending: Otolaryngology

## 2021-03-27 ENCOUNTER — Ambulatory Visit: Payer: BC Managed Care – PPO | Admitting: Anesthesiology

## 2021-03-27 ENCOUNTER — Other Ambulatory Visit: Payer: Self-pay

## 2021-03-27 ENCOUNTER — Ambulatory Visit
Admission: RE | Admit: 2021-03-27 | Discharge: 2021-03-27 | Disposition: A | Discharge status: Home / Self Care | Payer: BC Managed Care – PPO | Attending: Otolaryngology | Admitting: Otolaryngology

## 2021-03-27 ENCOUNTER — Encounter: Payer: Self-pay | Admitting: Otolaryngology

## 2021-03-27 DIAGNOSIS — Z79899 Other long term (current) drug therapy: Secondary | ICD-10-CM | POA: Insufficient documentation

## 2021-03-27 DIAGNOSIS — J351 Hypertrophy of tonsils: Secondary | ICD-10-CM | POA: Diagnosis not present

## 2021-03-27 DIAGNOSIS — J3501 Chronic tonsillitis: Secondary | ICD-10-CM | POA: Diagnosis not present

## 2021-03-27 DIAGNOSIS — J358 Other chronic diseases of tonsils and adenoids: Secondary | ICD-10-CM | POA: Diagnosis not present

## 2021-03-27 HISTORY — PX: TONSILLECTOMY: SHX5217

## 2021-03-27 SURGERY — TONSILLECTOMY
Anesthesia: General | Site: Throat | Laterality: Bilateral

## 2021-03-27 MED ORDER — MIDAZOLAM HCL 5 MG/5ML IJ SOLN
INTRAMUSCULAR | Status: DC | PRN
  Administered 2021-03-27: 2 mg via INTRAVENOUS

## 2021-03-27 MED ORDER — HYDROCODONE-ACETAMINOPHEN 7.5-325 MG/15ML PO SOLN: 15.0000 mL | ORAL | 0 refills | Status: DC | PRN

## 2021-03-27 MED ORDER — FENTANYL CITRATE (PF) 100 MCG/2ML IJ SOLN
25.0000 ug | INTRAMUSCULAR | Status: DC | PRN
  Administered 2021-03-27 (×2): 25 ug via INTRAVENOUS
  Administered 2021-03-27: 50 ug via INTRAVENOUS

## 2021-03-27 MED ORDER — PROPOFOL 10 MG/ML IV BOLUS
INTRAVENOUS | Status: DC | PRN
  Administered 2021-03-27: 150 mg via INTRAVENOUS

## 2021-03-27 MED ORDER — SCOPOLAMINE 1 MG/3DAYS TD PT72
1.0000 | MEDICATED_PATCH | TRANSDERMAL | Status: DC
  Administered 2021-03-27: 1.5 mg via TRANSDERMAL

## 2021-03-27 MED ORDER — OXYCODONE HCL 5 MG PO TABS
5.0000 mg | ORAL_TABLET | Freq: Once | ORAL | Status: AC | PRN
  Administered 2021-03-27: 5 mg via ORAL

## 2021-03-27 MED ORDER — ONDANSETRON HCL 4 MG/2ML IJ SOLN
4.0000 mg | Freq: Once | INTRAMUSCULAR | Status: AC | PRN
  Administered 2021-03-27: 4 mg via INTRAVENOUS

## 2021-03-27 MED ORDER — DEXMEDETOMIDINE HCL 200 MCG/2ML IV SOLN
INTRAVENOUS | Status: DC | PRN
  Administered 2021-03-27: 5 ug via INTRAVENOUS
  Administered 2021-03-27: 15 ug via INTRAVENOUS

## 2021-03-27 MED ORDER — DEXAMETHASONE SODIUM PHOSPHATE 4 MG/ML IJ SOLN
INTRAMUSCULAR | Status: DC | PRN
  Administered 2021-03-27: 10 mg via INTRAVENOUS

## 2021-03-27 MED ORDER — LACTATED RINGERS IV SOLN: INTRAVENOUS | Status: DC | PRN

## 2021-03-27 MED ORDER — ACETAMINOPHEN 10 MG/ML IV SOLN
1000.0000 mg | Freq: Once | INTRAVENOUS | Status: AC
  Administered 2021-03-27: 1000 mg via INTRAVENOUS

## 2021-03-27 MED ORDER — OXYCODONE HCL 5 MG/5ML PO SOLN
5.0000 mg | Freq: Once | ORAL | Status: AC | PRN
Start: 2021-03-27 — End: 2021-03-27

## 2021-03-27 MED ORDER — LIDOCAINE HCL (CARDIAC) PF 100 MG/5ML IV SOSY
PREFILLED_SYRINGE | INTRAVENOUS | Status: DC | PRN
  Administered 2021-03-27: 50 mg via INTRAVENOUS

## 2021-03-27 MED ORDER — SUCCINYLCHOLINE CHLORIDE 20 MG/ML IJ SOLN
INTRAMUSCULAR | Status: DC | PRN
  Administered 2021-03-27: 100 mg via INTRAVENOUS

## 2021-03-27 MED ORDER — GLYCOPYRROLATE 0.2 MG/ML IJ SOLN
INTRAMUSCULAR | Status: DC | PRN
  Administered 2021-03-27: .1 mg via INTRAVENOUS

## 2021-03-27 SURGICAL SUPPLY — 13 items
BLADE BOVIE TIP EXT 4 (BLADE) ×2 IMPLANT
CANISTER SUCT 1200ML W/VALVE (MISCELLANEOUS) ×2 IMPLANT
ELECT REM PT RETURN 9FT ADLT (ELECTROSURGICAL) ×2
ELECTRODE REM PT RTRN 9FT ADLT (ELECTROSURGICAL) ×1 IMPLANT
GLOVE PI ULTRA LF STRL 7.5 (GLOVE) ×1 IMPLANT
GLOVE PI ULTRA NON LATEX 7.5 (GLOVE) ×1
KIT TURNOVER KIT A (KITS) ×2 IMPLANT
PACK TONSIL AND ADENOID CUSTOM (PACKS) ×2 IMPLANT
PENCIL SMOKE EVACUATOR (MISCELLANEOUS) ×2 IMPLANT
SLEEVE SUCTION 125 (MISCELLANEOUS) ×2 IMPLANT
SOL ANTI-FOG 6CC FOG-OUT (MISCELLANEOUS) ×1 IMPLANT
SOL FOG-OUT ANTI-FOG 6CC (MISCELLANEOUS) ×1
STRAP BODY AND KNEE 60X3 (MISCELLANEOUS) ×2 IMPLANT

## 2021-03-27 NOTE — Anesthesia Postprocedure Evaluation (Signed)
Anesthesia Post Note  Patient: Melvin Kelly  Procedure(s) Performed: TONSILLECTOMY POSSIBLE ADENOID (Bilateral Throat)     Patient location during evaluation: PACU Anesthesia Type: General Level of consciousness: awake and alert and oriented Pain management: satisfactory to patient Vital Signs Assessment: post-procedure vital signs reviewed and stable Respiratory status: spontaneous breathing, nonlabored ventilation and respiratory function stable Cardiovascular status: blood pressure returned to baseline and stable Postop Assessment: Adequate PO intake and No signs of nausea or vomiting Anesthetic complications: no   No complications documented.  Cherly Beach

## 2021-03-27 NOTE — Anesthesia Procedure Notes (Signed)
Procedure Name: Intubation Date/Time: 03/27/2021 7:38 AM Performed by: Jimmy Picket, CRNA Pre-anesthesia Checklist: Patient identified, Emergency Drugs available, Suction available, Patient being monitored and Timeout performed Patient Re-evaluated:Patient Re-evaluated prior to induction Oxygen Delivery Method: Circle system utilized Preoxygenation: Pre-oxygenation with 100% oxygen Induction Type: IV induction Ventilation: Mask ventilation without difficulty Laryngoscope Size: Miller and 3 Grade View: Grade I Tube type: Oral Rae Tube size: 8.0 mm Number of attempts: 1 Placement Confirmation: ETT inserted through vocal cords under direct vision,  positive ETCO2 and breath sounds checked- equal and bilateral Tube secured with: Tape Dental Injury: Teeth and Oropharynx as per pre-operative assessment

## 2021-03-27 NOTE — Transfer of Care (Signed)
Immediate Anesthesia Transfer of Care Note  Patient: Melvin Kelly  Procedure(s) Performed: TONSILLECTOMY POSSIBLE ADENOID (Bilateral Throat)  Patient Location: PACU  Anesthesia Type: General ETT  Level of Consciousness: awake, alert  and patient cooperative  Airway and Oxygen Therapy: Patient Spontanous Breathing and Patient connected to supplemental oxygen  Post-op Assessment: Post-op Vital signs reviewed, Patient's Cardiovascular Status Stable, Respiratory Function Stable, Patent Airway and No signs of Nausea or vomiting  Post-op Vital Signs: Reviewed and stable  Complications: No complications documented.

## 2021-03-27 NOTE — Anesthesia Preprocedure Evaluation (Signed)
Anesthesia Evaluation  Patient identified by MRN, date of birth, ID band Patient awake    Reviewed: Allergy & Precautions, H&P , NPO status , Patient's Chart, lab work & pertinent test results  Airway Mallampati: II  TM Distance: >3 FB Neck ROM: full    Dental no notable dental hx.    Pulmonary    Pulmonary exam normal breath sounds clear to auscultation       Cardiovascular Normal cardiovascular exam Rhythm:regular Rate:Normal     Neuro/Psych    GI/Hepatic   Endo/Other    Renal/GU      Musculoskeletal   Abdominal   Peds  Hematology   Anesthesia Other Findings   Reproductive/Obstetrics                             Anesthesia Physical Anesthesia Plan  ASA: I  Anesthesia Plan: General ETT   Post-op Pain Management:    Induction:   PONV Risk Score and Plan: 2 and Treatment may vary due to age or medical condition, Scopolamine patch - Pre-op, Ondansetron and Dexamethasone  Airway Management Planned:   Additional Equipment:   Intra-op Plan:   Post-operative Plan:   Informed Consent: I have reviewed the patients History and Physical, chart, labs and discussed the procedure including the risks, benefits and alternatives for the proposed anesthesia with the patient or authorized representative who has indicated his/her understanding and acceptance.     Dental Advisory Given  Plan Discussed with: CRNA  Anesthesia Plan Comments:         Anesthesia Quick Evaluation

## 2021-03-27 NOTE — Op Note (Signed)
03/27/2021  8:08 AM    Melvin Kelly  [id number]   Pre-Op Dx: Chronic tonsillitis and tonsil lithiasis  Post-op Dx: Same  Proc: Tonsillectomy  Surg:  Beverly Sessions [name]  Anes:  GOT  EBL: 30 mL  Comp: None  Findings: Cryptic tonsils  Procedure: Patient was given general anesthesia by oral endotracheal intubation.  Once he was asleep a Vernelle Emerald was used to visualize the oropharynx.  His tonsils were 2+ but very cryptic.  The soft palate was retracted and the adenoids were already shrunk down and the nasopharynx was fairly flat.  I did not address the adenoids at all.  The left tonsil was grasped and pulled medially.  The anterior pillar was incised.  There is a fairly large vein at the superior pole that had to be cauterized.  The tonsil was dissected from its fossa using blunt dissection and electrocautery bleeding was controlled with direct pressure and electrocautery.  The procedure was then repeated on the right tonsil and again a large vein was found at the superior pole of the tonsil and this was cauterized to control bleeding.  The tonsil was removed from the fossa and was not a scarred down on the right side as I was unaware.  The bleeding was controlled with electrocautery.  The patient tolerated the procedure well.  He was awakened and taken to the recovery room in satisfactory condition.  There were no operative complications.  Dispo:   To PACU to be discharged home  Plan: To follow-up in the office in 2 to 3 weeks to make sure that the tonsils have healed well.  He will push fluids at home and make sure he gets in some protein shakes or something to keep his blood sugar stable.  I have written for Tylenol with hydrocodone liquid for him to use for pain  Cammy Copa  03/27/2021 8:08 AM

## 2021-03-27 NOTE — H&P (Signed)
H&P has been reviewed and patient reevaluated, no changes necessary. To be downloaded later.  

## 2021-03-28 ENCOUNTER — Encounter: Payer: Self-pay | Admitting: Otolaryngology

## 2021-03-28 LAB — SURGICAL PATHOLOGY

## 2021-04-01 ENCOUNTER — Other Ambulatory Visit: Payer: Self-pay | Admitting: Otolaryngology

## 2021-04-01 MED ORDER — HYDROCODONE-ACETAMINOPHEN 7.5-325 MG/15ML PO SOLN: 15.0000 mL | ORAL | 0 refills | Status: AC | PRN

## 2021-04-26 ENCOUNTER — Ambulatory Visit
Admission: EM | Admit: 2021-04-26 | Discharge: 2021-04-26 | Disposition: A | Discharge status: Home / Self Care | Payer: BC Managed Care – PPO | Attending: Physician Assistant | Admitting: Physician Assistant

## 2021-04-26 ENCOUNTER — Encounter: Payer: Self-pay | Admitting: Emergency Medicine

## 2021-04-26 ENCOUNTER — Other Ambulatory Visit: Payer: Self-pay

## 2021-04-26 DIAGNOSIS — R0981 Nasal congestion: Secondary | ICD-10-CM | POA: Diagnosis not present

## 2021-04-26 DIAGNOSIS — Z20822 Contact with and (suspected) exposure to covid-19: Secondary | ICD-10-CM | POA: Diagnosis not present

## 2021-04-26 DIAGNOSIS — J029 Acute pharyngitis, unspecified: Secondary | ICD-10-CM | POA: Insufficient documentation

## 2021-04-26 DIAGNOSIS — J069 Acute upper respiratory infection, unspecified: Secondary | ICD-10-CM | POA: Diagnosis not present

## 2021-04-26 DIAGNOSIS — R52 Pain, unspecified: Secondary | ICD-10-CM | POA: Insufficient documentation

## 2021-04-26 DIAGNOSIS — Z8616 Personal history of COVID-19: Secondary | ICD-10-CM | POA: Diagnosis not present

## 2021-04-26 LAB — POCT RAPID STREP A: Streptococcus, Group A Screen (Direct): NEGATIVE

## 2021-04-26 NOTE — ED Provider Notes (Signed)
[facility]    CSN: [id number] Arrival date & time: 04/26/21  0944      History   Chief Complaint Chief Complaint  Patient presents with   Nasal Congestion   Generalized Body Aches    HPI Melvin Kelly is a 20 y.o. male presenting for onset of sneezing, sore throat and nasal congestion/runny nose yesterday.  He denies any fevers, fatigue, achiness, cough, chest pain or breathing difficulty, vomiting or diarrhea.  No sick contacts and no known exposure to COVID-19.  Patient says that he has had COVID-19 twice, once in September 2020 and the second time in January 2022.  He is vaccinated for COVID-19 with [employer] vaccine and no booster.  He would like to be tested for COVID-19 again.  He has not been taking any OTC meds for his symptoms.  He does report that he had a tonsillectomy a couple weeks ago due to recurrent tonsil stones.  He has no other concerns.  HPI  Past Medical History:  Diagnosis Date   Headache    sinus(?) - 3-4x/week    There are no problems to display for this patient.   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   Procedure: ENDOSCOPIC CONCHA BULLOSA RESECTION;  Surgeon: [name], MD;  Location: [facility];  Service: ENT;  Laterality: Bilateral;   NASAL SEPTOPLASTY W/ TURBINOPLASTY Bilateral 06/15/2019   Procedure: NASAL SEPTOPLASTY WITH INFERIOR TURBINATE REDUCTION;  Surgeon: [name], MD;  Location: [facility];  Service: ENT;  Laterality: Bilateral;   TONSILLECTOMY Bilateral 03/27/2021   Procedure: TONSILLECTOMY POSSIBLE ADENOID;  Surgeon: [name], MD;  Location: [facility];  Service: ENT;  Laterality: Bilateral;  no adenoid tissue found   TONSILLECTOMY     WISDOM TOOTH EXTRACTION     age 85       Home Medications    Prior to Admission medications   Medication Sig Start Date End Date  Taking? Authorizing Provider  citalopram (CELEXA) 10 MG tablet Take 10 mg by mouth daily. 10/21/20  Yes [provider]  emtricitabine-tenofovir (TRUVADA) 200-300 MG tablet  10/24/20  Yes [provider]  montelukast (SINGULAIR) 10 MG tablet Take 10 mg by mouth at bedtime.   Yes [provider]  triamcinolone (NASACORT) 55 MCG/ACT AERO nasal inhaler Place 2 sprays into the nose daily.   Yes [provider]    Family History Family History  Problem Relation Age of Onset   Healthy Mother    Healthy Father     Social History Social History   Tobacco Use   Smoking status: Never   Smokeless tobacco: Never  Vaping Use   Vaping Use: Never used  Substance Use Topics   Alcohol use: No   Drug use: Never     Allergies   Patient has no known allergies.   Review of Systems Review of Systems  Constitutional:  Negative for fatigue and fever.  HENT:  Positive for congestion, rhinorrhea, sinus pressure and sneezing. Negative for sore throat.   Respiratory:  Negative for cough and shortness of breath.   Gastrointestinal:  Negative for abdominal pain, diarrhea, nausea and vomiting.  Musculoskeletal:  Negative for myalgias.  Neurological:  Negative for weakness, light-headedness and headaches.  Hematological:  Negative for adenopathy.    Physical Exam Triage Vital Signs ED Triage Vitals  Enc Vitals Group  BP 04/26/21 0954 121/75     Pulse Rate 04/26/21 0954 77     Resp 04/26/21 0954 16     Temp 04/26/21 0954 98.3 F (36.8 C)     Temp Source 04/26/21 0954 Oral     SpO2 04/26/21 0954 98 %     Weight 04/26/21 0951 154 lb (69.9 kg)     Height 04/26/21 0951 6\' 3"  (1.905 m)     Head Circumference --      Peak Flow --      Pain Score 04/26/21 0951 5     Pain Loc --      Pain Edu? --      Excl. in GC? --    No data found.  Updated Vital Signs BP 121/75 (BP Location: Left Arm)   Pulse 77   Temp 98.3 F (36.8 C) (Oral)   Resp 16   Ht 6\' 3"   (1.905 m)   Wt 154 lb (69.9 kg)   SpO2 98%   BMI 19.25 kg/m       Physical Exam Vitals and nursing note reviewed.  Constitutional:      General: He is not in acute distress.    Appearance: Normal appearance. He is well-developed. He is not ill-appearing or diaphoretic.  HENT:     Head: Normocephalic and atraumatic.     Nose: Congestion and rhinorrhea present.     Mouth/Throat:     Mouth: Mucous membranes are moist.     Pharynx: Oropharynx is clear. Uvula midline. Posterior oropharyngeal erythema present.     Tonsils: No tonsillar abscesses.     Comments: Absent tonsils Eyes:     General: No scleral icterus.       Right eye: No discharge.        Left eye: No discharge.     Conjunctiva/sclera: Conjunctivae normal.  Neck:     Thyroid: No thyromegaly.     Trachea: No tracheal deviation.  Cardiovascular:     Rate and Rhythm: Normal rate and regular rhythm.     Heart sounds: Normal heart sounds.  Pulmonary:     Effort: Pulmonary effort is normal. No respiratory distress.     Breath sounds: Normal breath sounds. No wheezing, rhonchi or rales.  Musculoskeletal:     Cervical back: Neck supple.  Skin:    General: Skin is warm and dry.     Findings: No rash.  Neurological:     General: No focal deficit present.     Mental Status: He is alert. Mental status is at baseline.     Motor: No weakness.     Gait: Gait normal.  Psychiatric:        Mood and Affect: Mood normal.        Behavior: Behavior normal.        Thought Content: Thought content normal.     UC Treatments / Results  Labs (all labs ordered are listed, but only abnormal results are displayed) Labs Reviewed  SARS CORONAVIRUS 2 (TAT 6-24 HRS)  CULTURE, GROUP A STREP [facility])  POCT RAPID STREP A, ED / UC  POCT RAPID STREP A    EKG   Radiology No results found.  Procedures Procedures (including critical care time)  Medications Ordered in UC Medications - No data to display  Initial Impression /  Assessment and Plan / UC Course  I have reviewed the triage vital signs and the nursing notes.  Pertinent labs & imaging results that were available during my  care of the patient were reviewed by me and considered in my medical decision making (see chart for details).  20 year old male presenting for congestion and sore throat that started yesterday.  Vital signs are all stable.  Exam significant for mild congestion and clear rhinorrhea as well as mild posterior pharyngeal erythema.  Rapid strep test is negative.  Then a COVID is performed.  Current CDC guidelines, isolation protocol and ED precautions reviewed with patient.  Advised he likely has a viral URI.  Supportive care encouraged with increasing rest and fluids and taking over-the-counter Mucinex D and Flonase/nasal saline.  Advised to follow-up as needed for any worsening symptoms or if not feeling better in the next 7 to 10 days.   Final Clinical Impressions(s) / UC Diagnoses   Final diagnoses:  Viral upper respiratory tract infection  Nasal congestion  Sore throat     Discharge Instructions      URI/COLD SYMPTOMS: Your exam today is consistent with a viral illness. Antibiotics are not indicated at this time. Use medications as directed, including cough syrup, nasal saline, and decongestants. Your symptoms should improve over the next few days and resolve within 7-10 days. Increase rest and fluids. F/u if symptoms worsen or predominate such as sore throat, ear pain, productive cough, shortness of breath, or if you develop high fevers or worsening fatigue over the next several days.    You have received COVID testing today either for positive exposure, concerning symptoms that could be related to COVID infection, screening purposes, or re-testing after confirmed positive.  Your test obtained today checks for active viral infection in the last 1-2 weeks. If your test is negative now, you can still test positive later. So, if you do  develop symptoms you should either get re-tested and/or isolate x 5 days and then strict mask use x 5 days (unvaccinated) or mask use x 10 days (vaccinated). Please follow CDC guidelines.  While Rapid antigen tests come back in 15-20 minutes, send out PCR/molecular test results typically come back within 1-3 days. In the mean time, if you are symptomatic, assume this could be a positive test and treat/monitor yourself as if you do have COVID.   We will call with test results if positive. Please download the MyChart app and set up a profile to access test results.   If symptomatic, go home and rest. Push fluids. Take Tylenol as needed for discomfort. Gargle warm salt water. Throat lozenges. Take Mucinex DM or Robitussin for cough. Humidifier in bedroom to ease coughing. Warm showers. Also review the COVID handout for more information.  COVID-19 INFECTION: The incubation period of COVID-19 is approximately 14 days after exposure, with most symptoms developing in roughly 4-5 days. Symptoms may range in severity from mild to critically severe. Roughly 80% of those infected will have mild symptoms. People of any age may become infected with COVID-19 and have the ability to transmit the virus. The most common symptoms include: fever, fatigue, cough, body aches, headaches, sore throat, nasal congestion, shortness of breath, nausea, vomiting, diarrhea, changes in smell and/or taste.    COURSE OF ILLNESS Some patients may begin with mild disease which can progress quickly into critical symptoms. If your symptoms are worsening please call ahead to the Emergency Department and proceed there for further treatment. Recovery time appears to be roughly 1-2 weeks for mild symptoms and 3-6 weeks for severe disease.   GO IMMEDIATELY TO ER FOR FEVER YOU ARE UNABLE TO GET DOWN WITH TYLENOL, BREATHING  PROBLEMS, CHEST PAIN, FATIGUE, LETHARGY, INABILITY TO EAT OR DRINK, ETC  QUARANTINE AND ISOLATION: To help decrease the  spread of COVID-19 please remain isolated if you have COVID infection or are highly suspected to have COVID infection. This means -stay home and isolate to one room in the home if you live with others. Do not share a bed or bathroom with others while ill, sanitize and wipe down all countertops and keep common areas clean and disinfected. Stay home for 5 days. If you have no symptoms or your symptoms are resolving after 5 days, you can leave your house. Continue to wear a mask around others for 5 additional days. If you have been in close contact (within 6 feet) of someone diagnosed with COVID 19, you are advised to quarantine in your home for 14 days as symptoms can develop anywhere from 2-14 days after exposure to the virus. If you develop symptoms, you  must isolate.  Most current guidelines for COVID after exposure -unvaccinated: isolate 5 days and strict mask use x 5 days. Test on day 5 is possible -vaccinated: wear mask x 10 days if symptoms do not develop -You do not necessarily need to be tested for COVID if you have + exposure and  develop symptoms. Just isolate at home x10 days from symptom onset During this global pandemic, CDC advises to practice social distancing, try to stay at least 74ft away from others at all times. Wear a face covering. Wash and sanitize your hands regularly and avoid going anywhere that is not necessary.  KEEP IN MIND THAT THE COVID TEST IS NOT 100% ACCURATE AND YOU SHOULD STILL DO EVERYTHING TO PREVENT POTENTIAL SPREAD OF VIRUS TO OTHERS (WEAR MASK, WEAR GLOVES, WASH HANDS AND SANITIZE REGULARLY). IF INITIAL TEST IS NEGATIVE, THIS MAY NOT MEAN YOU ARE DEFINITELY NEGATIVE. MOST ACCURATE TESTING IS DONE 5-7 DAYS AFTER EXPOSURE.   It is not advised by CDC to get re-tested after receiving a positive COVID test since you can still test positive for weeks to months after you have already cleared the virus.   *If you have not been vaccinated for COVID, I strongly suggest you  consider getting vaccinated as long as there are no contraindications.       ED Prescriptions   None    PDMP not reviewed this encounter.   Shirlee Latch, PA-C 04/26/21 1120

## 2021-04-26 NOTE — Discharge Instructions (Addendum)

## 2021-04-26 NOTE — ED Triage Notes (Signed)
Patient c/o sneezing, nasal congestion, and sinus pressure that started yesterday.  Patient states that he had a tonsillectomy 2-3 weeks ago.  Patient also reports bodyaches.  Patient denies fevers.

## 2021-04-28 ENCOUNTER — Other Ambulatory Visit: Payer: Self-pay

## 2021-04-28 LAB — SARS CORONAVIRUS 2 (TAT 6-24 HRS): SARS Coronavirus 2: NEGATIVE

## 2021-04-30 LAB — CULTURE, GROUP A STREP (THRC)

## 2021-06-10 DIAGNOSIS — Z23 Encounter for immunization: Secondary | ICD-10-CM | POA: Diagnosis not present

## 2021-06-10 DIAGNOSIS — Z020 Encounter for examination for admission to educational institution: Secondary | ICD-10-CM | POA: Diagnosis not present

## 2021-06-10 DIAGNOSIS — Z111 Encounter for screening for respiratory tuberculosis: Secondary | ICD-10-CM | POA: Diagnosis not present

## 2021-07-10 DIAGNOSIS — B078 Other viral warts: Secondary | ICD-10-CM | POA: Diagnosis not present

## 2021-07-17 DIAGNOSIS — R519 Headache, unspecified: Secondary | ICD-10-CM | POA: Insufficient documentation

## 2021-07-17 DIAGNOSIS — R112 Nausea with vomiting, unspecified: Secondary | ICD-10-CM | POA: Diagnosis not present

## 2021-07-17 DIAGNOSIS — H538 Other visual disturbances: Secondary | ICD-10-CM | POA: Insufficient documentation

## 2021-07-17 HISTORY — DX: Headache, unspecified: R51.9

## 2021-07-17 HISTORY — DX: Nausea with vomiting, unspecified: R11.2

## 2021-08-04 ENCOUNTER — Other Ambulatory Visit: Payer: Self-pay

## 2021-08-04 ENCOUNTER — Encounter: Payer: Self-pay | Admitting: Emergency Medicine

## 2021-08-04 ENCOUNTER — Ambulatory Visit
Admission: EM | Admit: 2021-08-04 | Discharge: 2021-08-04 | Disposition: A | Discharge status: Home / Self Care | Payer: BC Managed Care – PPO | Attending: Family Medicine | Admitting: Family Medicine

## 2021-08-04 DIAGNOSIS — G44201 Tension-type headache, unspecified, intractable: Secondary | ICD-10-CM | POA: Diagnosis not present

## 2021-08-04 DIAGNOSIS — R519 Headache, unspecified: Secondary | ICD-10-CM

## 2021-08-04 MED ORDER — PROMETHAZINE HCL 25 MG PO TABS: 25.0000 mg | ORAL_TABLET | Freq: Three times a day (TID) | ORAL | 0 refills | Status: DC | PRN

## 2021-08-04 MED ORDER — KETOROLAC TROMETHAMINE 60 MG/2ML IM SOLN
60.0000 mg | Freq: Once | INTRAMUSCULAR | Status: AC
  Administered 2021-08-04: 60 mg via INTRAMUSCULAR

## 2021-08-04 MED ORDER — BUTALBITAL-APAP-CAFFEINE 50-325-40 MG PO TABS: 1.0000 | ORAL_TABLET | Freq: Four times a day (QID) | ORAL | 0 refills | Status: DC | PRN

## 2021-08-04 NOTE — ED Triage Notes (Signed)
Pt c/o headache, nausea, vomiting and sweats. Started this morning. He has h/o migraines but this one is worse. He denies URI symptoms. Pt is tearful during triage. He states he has seen a neurologist and given medications but states they do not work.

## 2021-08-04 NOTE — Discharge Instructions (Signed)
Go home and rest.  Medication as prescribed.  Follow up with your neurologist.  Take care  Dr. Adriana Simas

## 2021-08-04 NOTE — ED Provider Notes (Signed)
[facility]    CSN: [id number] Arrival date & time: 08/04/21  1124      History   Chief Complaint Chief Complaint  Patient presents with   Headache   HPI 20 year old male presents with a severe headache.  Patient reports that his headache started this morning.  Started around 5 AM.  He states that the pain is posterior and radiates to the frontal region.  He reports associated nausea and vomiting.  No photophobia.  He has a history of headaches.  He has recently seen neurology and has not responded well to the medications that were prescribed.  Patient denies any respiratory symptoms.  No fever.  He is crying throughout history and physical exam.  He states that his pain is 10/10 in severity.  He has taken ibuprofen and Tylenol without relief.   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   Procedure: ENDOSCOPIC CONCHA BULLOSA RESECTION;  Surgeon: [name], MD;  Location: [facility];  Service: ENT;  Laterality: Bilateral;   NASAL SEPTOPLASTY W/ TURBINOPLASTY Bilateral 06/15/2019   Procedure: NASAL SEPTOPLASTY WITH INFERIOR TURBINATE REDUCTION;  Surgeon: [name], MD;  Location: [facility];  Service: ENT;  Laterality: Bilateral;   TONSILLECTOMY Bilateral 03/27/2021   Procedure: TONSILLECTOMY POSSIBLE ADENOID;  Surgeon: [name], MD;  Location: [facility];  Service: ENT;  Laterality: Bilateral;  no adenoid tissue found   TONSILLECTOMY     WISDOM TOOTH EXTRACTION     age 2       Home Medications    Prior to Admission medications   Medication Sig Start Date End Date Taking? Authorizing Provider  butalbital-acetaminophen-caffeine (FIORICET) 50-325-40 MG tablet Take 1 tablet by mouth every 6 (six) hours as needed for headache. 08/04/21 08/04/22 Yes Rea Reser  G, DO  citalopram (CELEXA) 10 MG tablet Take 10 mg by mouth daily. 10/21/20  Yes [provider]  emtricitabine-tenofovir (TRUVADA) 200-300 MG tablet  10/24/20  Yes [provider]  montelukast (SINGULAIR) 10 MG tablet Take 10 mg by mouth at bedtime.   Yes [provider]  promethazine (PHENERGAN) 25 MG tablet Take 1 tablet (25 mg total) by mouth every 8 (eight) hours as needed for nausea or vomiting. 08/04/21  Yes Gladis Soley G, DO  triamcinolone (NASACORT) 55 MCG/ACT AERO nasal inhaler Place 2 sprays into the nose daily.   Yes [provider]    Family History Family History  Problem Relation Age of Onset   Healthy Mother    Healthy Father     Social History Social History   Tobacco Use   Smoking status: Never   Smokeless tobacco: Never  Vaping Use   Vaping Use: Never used  Substance Use Topics   Alcohol use: No   Drug use: Never     Allergies   Patient has no known allergies.   Review of Systems Review of Systems Per HPI  Physical Exam Triage Vital Signs ED Triage Vitals  Enc Vitals Group     BP 08/04/21 1155 139/86     Pulse Rate 08/04/21 1155 75     Resp 08/04/21 1155 18     Temp 08/04/21 1155 98 F (36.7 C)     Temp Source 08/04/21 1155 Oral     SpO2 08/04/21 1155 98 %  Weight 08/04/21 1152 154 lb 1.6 oz (69.9 kg)     Height 08/04/21 1152 6\' 3"  (1.905 m)     Head Circumference --      Peak Flow --      Pain Score 08/04/21 1152 10     Pain Loc --      Pain Edu? --      Excl. in GC? --    Updated Vital Signs BP 139/86 (BP Location: Left Arm)   Pulse 75   Temp 98 F (36.7 C) (Oral)   Resp 18   Ht 6\' 3"  (1.905 m)   Wt 69.9 kg   SpO2 98%   BMI 19.26 kg/m   Visual Acuity Right Eye Distance:   Left Eye Distance:   Bilateral Distance:    Right Eye Near:   Left Eye Near:    Bilateral Near:     Physical Exam Vitals and nursing note reviewed.  Constitutional:      General: He is not in acute  distress. HENT:     Head: Normocephalic and atraumatic.  Eyes:     General:        Right eye: No discharge.        Left eye: No discharge.     Conjunctiva/sclera: Conjunctivae normal.     Pupils: Pupils are equal, round, and reactive to light.  Cardiovascular:     Rate and Rhythm: Normal rate and regular rhythm.  Pulmonary:     Effort: Pulmonary effort is normal.     Breath sounds: Normal breath sounds. No wheezing, rhonchi or rales.  Neurological:     General: No focal deficit present.     Mental Status: He is alert.     UC Treatments / Results  Labs (all labs ordered are listed, but only abnormal results are displayed) Labs Reviewed - No data to display  EKG   Radiology No results found.  Procedures Procedures (including critical care time)  Medications Ordered in UC Medications  ketorolac (TORADOL) injection 60 mg (60 mg Intramuscular Given 08/04/21 1212)    Initial Impression / Assessment and Plan / UC Course  I have reviewed the triage vital signs and the nursing notes.  Pertinent labs & imaging results that were available during my care of the patient were reviewed by me and considered in my medical decision making (see chart for details).    20 year old male presents with acute headache.  Suspect that this is is a tension headache.  Toradol given in clinic.  Sending home on Promethazine DM and Fioricet.  Advised follow-up with neurology.  Final Clinical Impressions(s) / UC Diagnoses   Final diagnoses:  Acute intractable tension-type headache     Discharge Instructions      Go home and rest.  Medication as prescribed.  Follow up with your neurologist.  Take care  Dr. 08/06/21    ED Prescriptions     Medication Sig Dispense Auth. Provider   promethazine (PHENERGAN) 25 MG tablet Take 1 tablet (25 mg total) by mouth every 8 (eight) hours as needed for nausea or vomiting. 30 tablet Overton Boggus G, DO   butalbital-acetaminophen-caffeine (FIORICET)  50-325-40 MG tablet Take 1 tablet by mouth every 6 (six) hours as needed for headache. 20 tablet 12-21-2003 G, DO      I have reviewed the PDMP during this encounter.   01-[birth year]-1984, Everlene Other 08/04/21 1412

## 2021-08-05 DIAGNOSIS — M5383 Other specified dorsopathies, cervicothoracic region: Secondary | ICD-10-CM | POA: Diagnosis not present

## 2021-08-05 DIAGNOSIS — M9902 Segmental and somatic dysfunction of thoracic region: Secondary | ICD-10-CM | POA: Diagnosis not present

## 2021-08-05 DIAGNOSIS — G44209 Tension-type headache, unspecified, not intractable: Secondary | ICD-10-CM | POA: Diagnosis not present

## 2021-08-05 DIAGNOSIS — M9901 Segmental and somatic dysfunction of cervical region: Secondary | ICD-10-CM | POA: Diagnosis not present

## 2021-08-06 DIAGNOSIS — M9902 Segmental and somatic dysfunction of thoracic region: Secondary | ICD-10-CM | POA: Diagnosis not present

## 2021-08-06 DIAGNOSIS — M5383 Other specified dorsopathies, cervicothoracic region: Secondary | ICD-10-CM | POA: Diagnosis not present

## 2021-08-06 DIAGNOSIS — G44209 Tension-type headache, unspecified, not intractable: Secondary | ICD-10-CM | POA: Diagnosis not present

## 2021-08-06 DIAGNOSIS — M9901 Segmental and somatic dysfunction of cervical region: Secondary | ICD-10-CM | POA: Diagnosis not present

## 2021-08-12 DIAGNOSIS — M5383 Other specified dorsopathies, cervicothoracic region: Secondary | ICD-10-CM | POA: Diagnosis not present

## 2021-08-12 DIAGNOSIS — M9901 Segmental and somatic dysfunction of cervical region: Secondary | ICD-10-CM | POA: Diagnosis not present

## 2021-08-12 DIAGNOSIS — M9902 Segmental and somatic dysfunction of thoracic region: Secondary | ICD-10-CM | POA: Diagnosis not present

## 2021-08-12 DIAGNOSIS — G44209 Tension-type headache, unspecified, not intractable: Secondary | ICD-10-CM | POA: Diagnosis not present

## 2021-08-13 DIAGNOSIS — M9901 Segmental and somatic dysfunction of cervical region: Secondary | ICD-10-CM | POA: Diagnosis not present

## 2021-08-13 DIAGNOSIS — M5383 Other specified dorsopathies, cervicothoracic region: Secondary | ICD-10-CM | POA: Diagnosis not present

## 2021-08-13 DIAGNOSIS — G44209 Tension-type headache, unspecified, not intractable: Secondary | ICD-10-CM | POA: Diagnosis not present

## 2021-08-13 DIAGNOSIS — M9902 Segmental and somatic dysfunction of thoracic region: Secondary | ICD-10-CM | POA: Diagnosis not present

## 2021-09-16 DIAGNOSIS — G43009 Migraine without aura, not intractable, without status migrainosus: Secondary | ICD-10-CM | POA: Diagnosis not present

## 2021-09-16 DIAGNOSIS — H538 Other visual disturbances: Secondary | ICD-10-CM | POA: Diagnosis not present

## 2021-09-16 DIAGNOSIS — R110 Nausea: Secondary | ICD-10-CM | POA: Diagnosis not present

## 2021-10-21 DIAGNOSIS — R110 Nausea: Secondary | ICD-10-CM | POA: Diagnosis not present

## 2021-10-21 DIAGNOSIS — G43009 Migraine without aura, not intractable, without status migrainosus: Secondary | ICD-10-CM | POA: Diagnosis not present

## 2021-10-21 DIAGNOSIS — H538 Other visual disturbances: Secondary | ICD-10-CM | POA: Diagnosis not present

## 2021-11-18 DIAGNOSIS — R112 Nausea with vomiting, unspecified: Secondary | ICD-10-CM | POA: Diagnosis not present

## 2021-11-18 DIAGNOSIS — H538 Other visual disturbances: Secondary | ICD-10-CM | POA: Diagnosis not present

## 2021-11-18 DIAGNOSIS — R519 Headache, unspecified: Secondary | ICD-10-CM | POA: Diagnosis not present

## 2022-02-12 IMAGING — US US SCROTUM W/ DOPPLER COMPLETE
1 series · 14 of 25 positions shown · non-contrast
Comparison: None.

CLINICAL DATA: 18-year-old male with left sided testicular pain.

EXAM:
SCROTAL ULTRASOUND
DOPPLER ULTRASOUND OF THE TESTICLES
TECHNIQUE: Complete ultrasound examination of the testicles, epididymis, and
other scrotal structures was performed. Color and spectral Doppler
ultrasound were also utilized to evaluate blood flow to the
testicles.

[Series 1: us scrotum w/doppler · 14 of 46 slices shown]
[im 1/46]
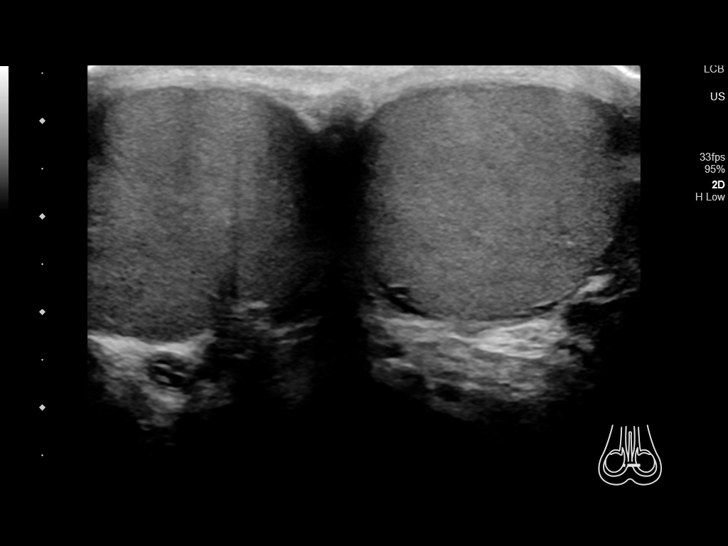
[im 4/46]
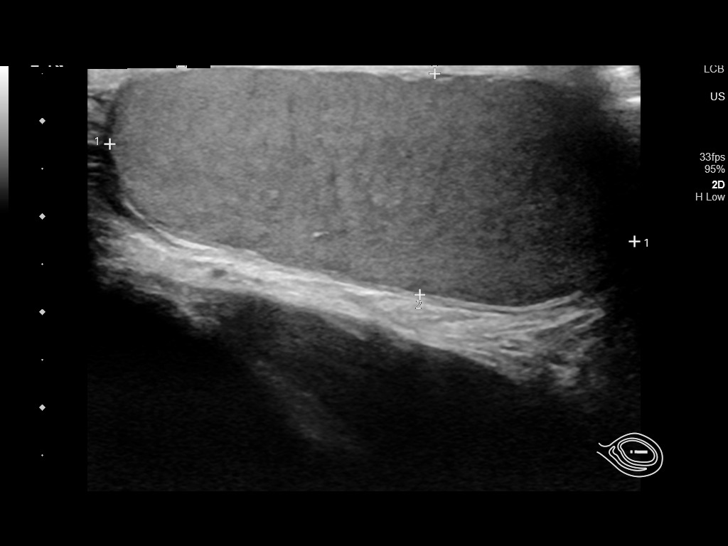
[im 8/46]
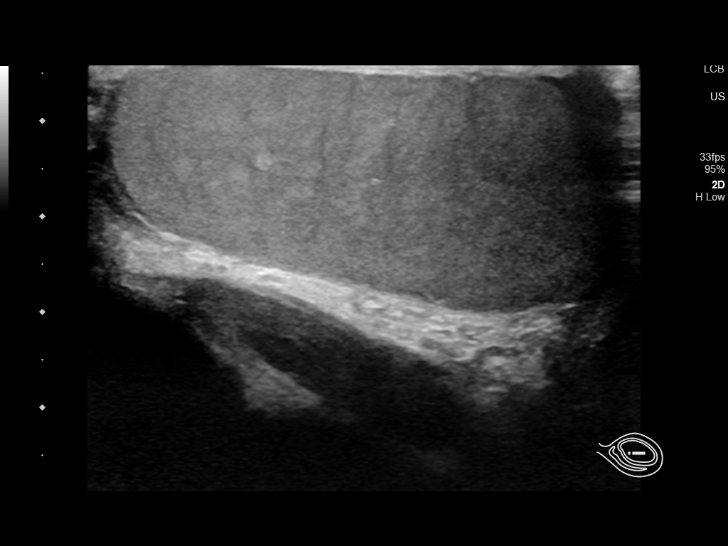
[im 12/46]
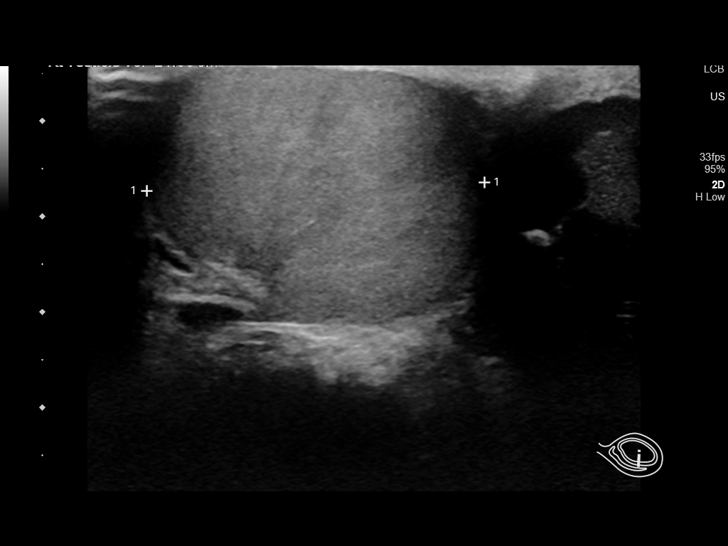
[im 16/46]
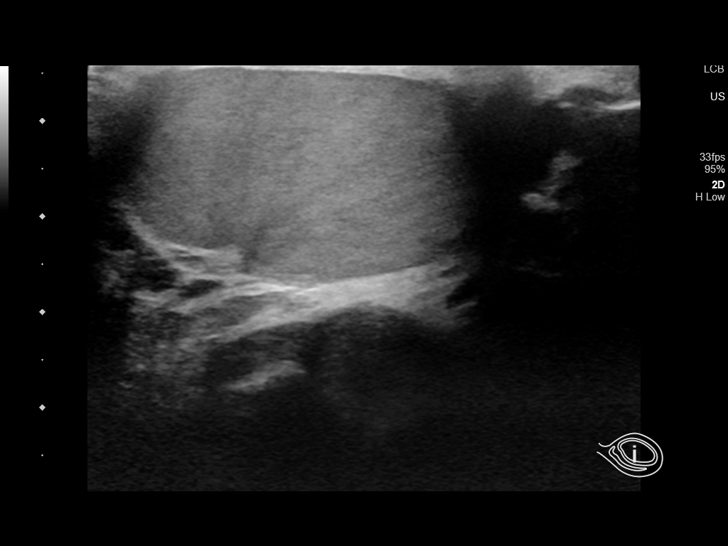
[im 17/46]
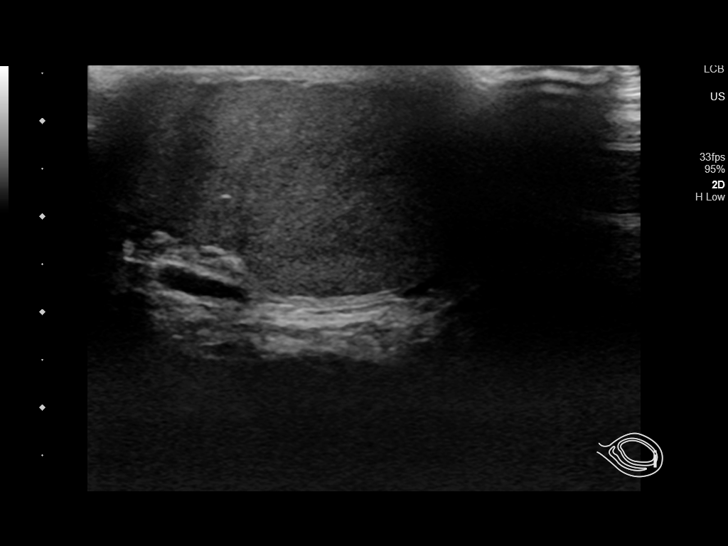
[im 21/46]
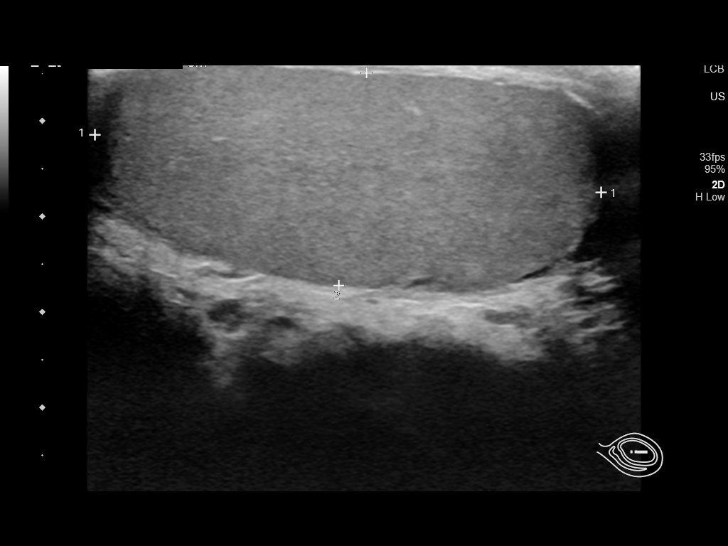
[im 25/46]
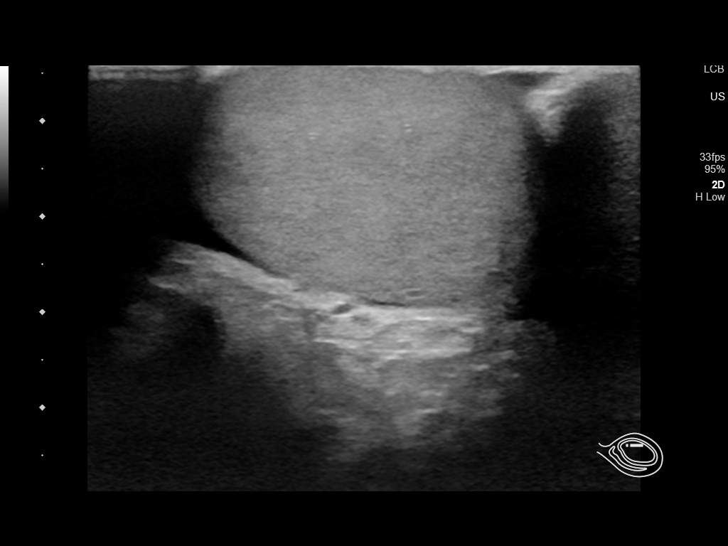
[im 29/46]
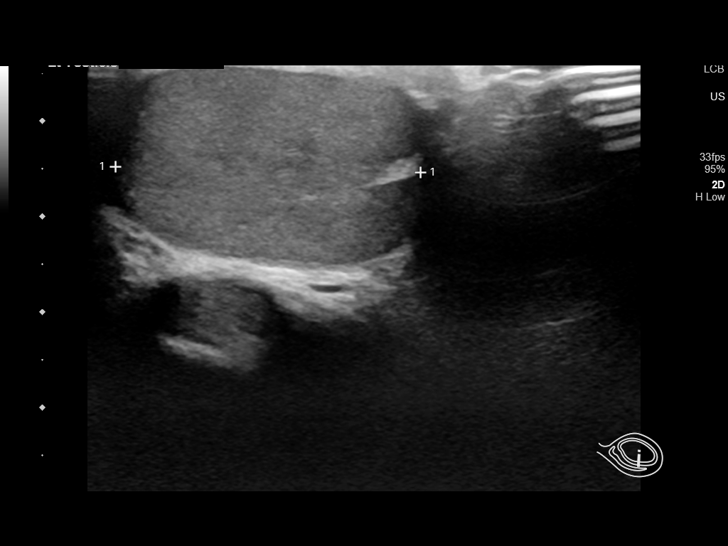
[im 31/46]
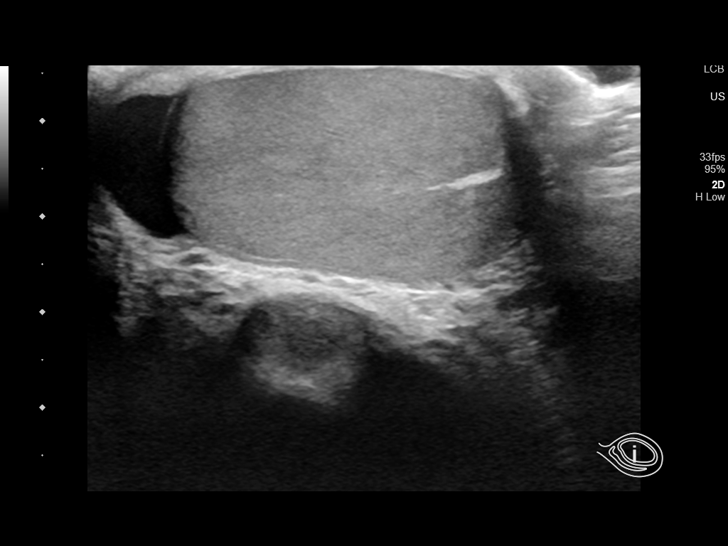
[im 34/46]
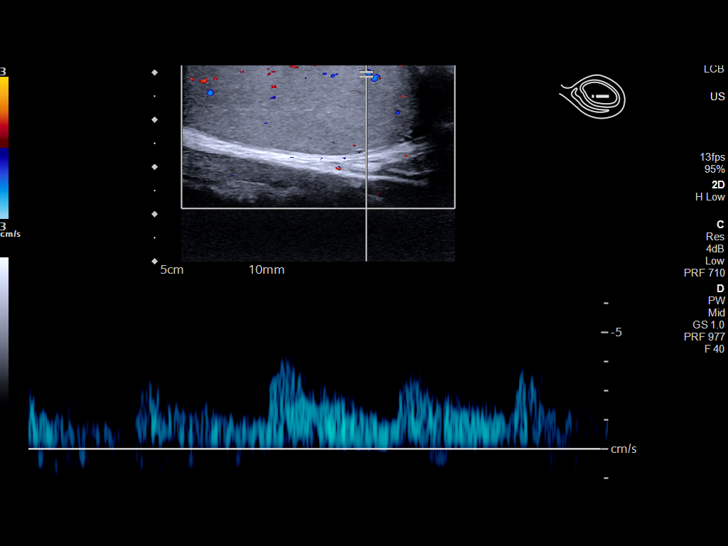
[im 38/46]
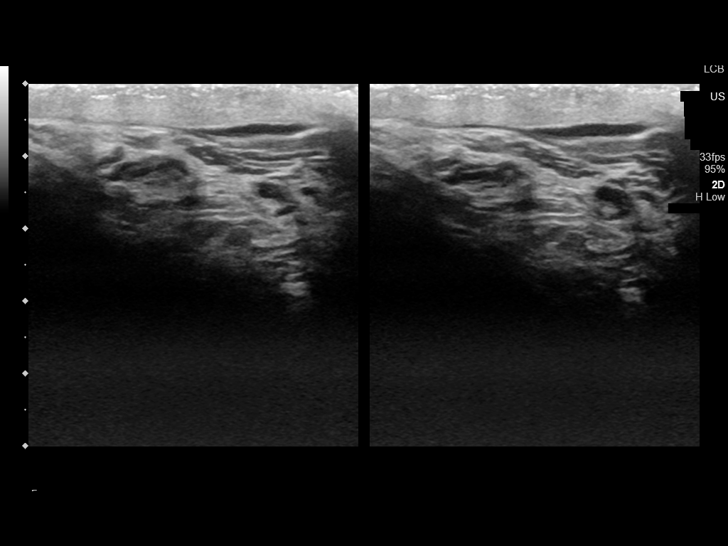
[im 42/46]
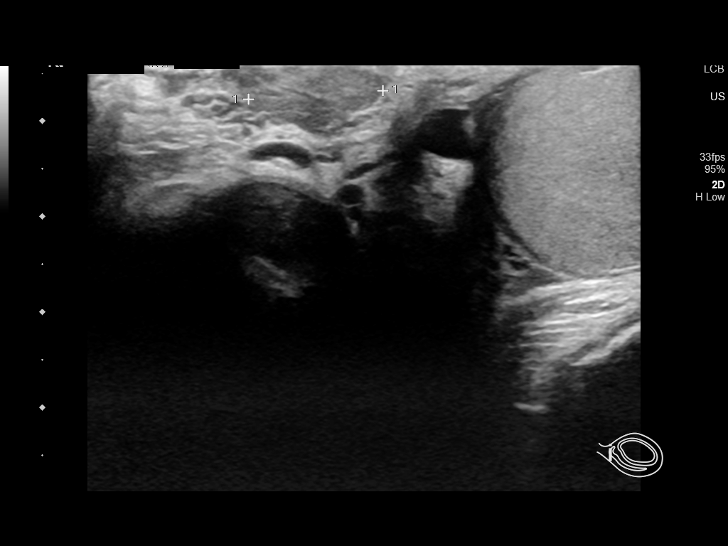
[im 46/46]
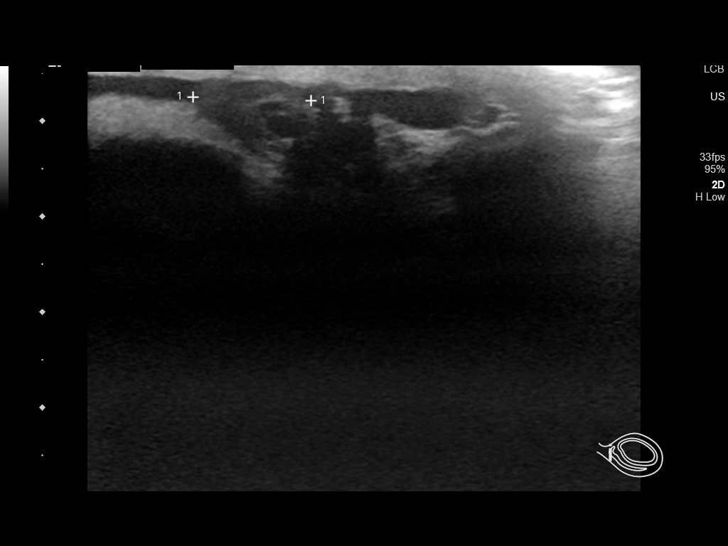

[14 of 25 positions shown; findings below may reference images not displayed]

FINDINGS: Right testicle

Measurements: 5.6 x 2.3 x 3.5 cm. No mass or microlithiasis
visualized.

Left testicle

Measurements: 5.3 x 2.2 x 3.2 cm. No mass or microlithiasis
visualized.

Right epididymis:  Normal in size and appearance.

Left epididymis:  Normal in size and appearance.

Hydrocele:  None visualized.

Varicocele:  None visualized.  There is a 4 mm left scrotal pearl.

Pulsed Doppler interrogation of both testes demonstrates normal low
resistance arterial and venous waveforms bilaterally.
IMPRESSION: A 4 mm left scrotal pearl, otherwise unremarkable testicular
ultrasound.

## 2022-03-05 DIAGNOSIS — H6983 Other specified disorders of Eustachian tube, bilateral: Secondary | ICD-10-CM | POA: Diagnosis not present

## 2022-03-05 DIAGNOSIS — J019 Acute sinusitis, unspecified: Secondary | ICD-10-CM | POA: Diagnosis not present

## 2022-03-26 ENCOUNTER — Ambulatory Visit
Admission: EM | Admit: 2022-03-26 | Discharge: 2022-03-26 | Disposition: A | Discharge status: Home / Self Care | Payer: BC Managed Care – PPO | Attending: Emergency Medicine | Admitting: Emergency Medicine

## 2022-03-26 DIAGNOSIS — J069 Acute upper respiratory infection, unspecified: Secondary | ICD-10-CM | POA: Diagnosis not present

## 2022-03-26 DIAGNOSIS — Z113 Encounter for screening for infections with a predominantly sexual mode of transmission: Secondary | ICD-10-CM | POA: Insufficient documentation

## 2022-03-26 DIAGNOSIS — J029 Acute pharyngitis, unspecified: Secondary | ICD-10-CM

## 2022-03-26 MED ORDER — BENZONATATE 100 MG PO CAPS: 200.0000 mg | ORAL_CAPSULE | Freq: Three times a day (TID) | ORAL | 0 refills | Status: DC

## 2022-03-26 MED ORDER — IPRATROPIUM BROMIDE 0.06 % NA SOLN
2.0000 | Freq: Four times a day (QID) | NASAL | 12 refills | Status: DC
Start: 2022-03-26 — End: 2022-07-28

## 2022-03-26 NOTE — ED Triage Notes (Signed)
Patient is here for "sore throat". I also think I may have been exposed to STI (? Related to s/t due to oral sex). No penis pain. No discharge. No dysuria. Also, recently treated for Sinus infection (? Resolved).  ?

## 2022-03-26 NOTE — Discharge Instructions (Signed)
Use the Atrovent nasal spray, 2 squirts in each nostril every 6 hours, as needed for runny nose and postnasal drip. ? ?Use the Tessalon Perles every 8 hours during the day.  Take them with a small sip of water.  They may give you some numbness to the base of your tongue or a metallic taste in your mouth, this is normal. ? ?We will send your throat swab for GC/Ch testing. We will call you only if the result is positive.. ? ?Gargle with warm salt water 2-3 times a day to soothe your throat, aid in pain relief, and aid in healing. ? ?Take over-the-counter ibuprofen according to the package instructions as needed for pain. ? ?You can also use Chloraseptic or Sucrets lozenges, 1 lozenge every 2 hours as needed for throat pain. ? ?If you develop any new or worsening symptoms return for reevaluation.  ? ?Return for reevaluation or see your primary care provider for any new or worsening symptoms.  ?

## 2022-03-26 NOTE — ED Provider Notes (Signed)
?[facility] ? ? ? ?CSN: [id number] ?Arrival date & time: 03/26/22  1859 ? ? ?  ? ?History   ?Chief Complaint ?Chief Complaint  ?Patient presents with  ? Sore Throat  ? ? ?HPI ?Melvin Kelly is a 21 y.o. male.  ? ?HPI ? ?-year-old male here for evaluation of sore throat. ? ?Patient ports that he has been experiencing a sore throat for the past 2 days.  He is concerned that he may have contracted an STI following oral sex on a new partner that was unprotected.  He states he is unsure if his partner is having the symptoms as he has not talked to him.  He does endorse that the throat is scratchy in nature and is also had some runny nose, nasal congestion, and a nonproductive cough.  He denies any fever, ear pain, or seeing any white spots in the back of his throat.  Patient has had a tonsillectomy. ? ?Past Medical History:  ?Diagnosis Date  ? Headache   ? sinus(?) - 3-4x/week  ? ? ?Patient Active Problem List  ? Diagnosis Date Noted  ? Blurred vision 07/17/2021  ? Headache disorder 07/17/2021  ? Nausea and vomiting 07/17/2021  ? Migraine without status migrainosus, not intractable 07/21/2018  ? ? ?Past Surgical History:  ?Procedure Laterality Date  ? APPENDECTOMY    ? age 37  ? ENDOSCOPIC CONCHA BULLOSA RESECTION Bilateral 06/15/2019  ? Procedure: ENDOSCOPIC CONCHA BULLOSA RESECTION;  Surgeon: [name], MD;  Location: [facility];  Service: ENT;  Laterality: Bilateral;  ? NASAL SEPTOPLASTY W/ TURBINOPLASTY Bilateral 06/15/2019  ? Procedure: NASAL SEPTOPLASTY WITH INFERIOR TURBINATE REDUCTION;  Surgeon: [name], MD;  Location: [facility];  Service: ENT;  Laterality: Bilateral;  ? TONSILLECTOMY Bilateral 03/27/2021  ? Procedure: TONSILLECTOMY POSSIBLE ADENOID;  Surgeon: [name], MD;  Location: [facility];  Service: ENT;  Laterality: Bilateral;  no adenoid tissue found  ? TONSILLECTOMY    ? WISDOM TOOTH EXTRACTION    ? age 38  ? ? ? ? ? ?Home Medications   ? ?Prior to  Admission medications   ?Medication Sig Start Date End Date Taking? Authorizing Provider  ?benzonatate (TESSALON) 100 MG capsule Take 2 capsules (200 mg total) by mouth every 8 (eight) hours. 03/26/22  Yes Becky Augusta, NP  ?citalopram (CELEXA) 20 MG tablet Take 1 tablet by mouth daily. 06/20/21  Yes [provider]  ?EMGALITY 120 MG/ML SOAJ SMARTSIG:120 Milligram(s) SUB-Q Once a Month 03/09/22  Yes [provider]  ?emtricitabine-tenofovir (TRUVADA) 200-300 MG tablet Take 1 tablet by mouth daily. 05/26/21  Yes [provider]  ?Galcanezumab-gnlm 120 MG/ML SOAJ Inject into the skin. 11/18/21  Yes [provider]  ?ipratropium (ATROVENT) 0.06 % nasal spray Place 2 sprays into both nostrils 4 (four) times daily. 03/26/22  Yes Becky Augusta, NP  ?SUMAtriptan (IMITREX) 100 MG tablet Take 50-100mg  at headache onset. May take a second dose after 2 hours if needed. 07/17/21  Yes [provider]  ?topiramate (TOPAMAX) 25 MG tablet Take 50mg  nightly and continue this dose. 11/18/21  Yes [provider]  ?butalbital-acetaminophen-caffeine (FIORICET) 50-325-40 MG tablet Take 1 tablet by mouth every 6 (six) hours as needed for headache. 08/04/21 08/04/22  08/06/22, DO  ?citalopram (CELEXA) 10 MG tablet Take 10 mg by mouth daily. 10/21/20   [provider]  ?montelukast (SINGULAIR) 10 MG tablet Take 10 mg by mouth at bedtime.    [provider]  ?  nortriptyline (PAMELOR) 10 MG capsule Take 20 mg by mouth at bedtime. 12/12/21   [provider]  ?predniSONE (STERAPRED UNI-PAK 21 TAB) 10 MG (21) TBPK tablet Take by mouth. 03/10/22   [provider]  ?promethazine (PHENERGAN) 25 MG tablet Take 1 tablet (25 mg total) by mouth every 8 (eight) hours as needed for nausea or vomiting. 08/04/21   Tommie Sams, DO  ?rizatriptan (MAXALT-MLT) 10 MG disintegrating tablet Take by mouth. 11/13/21   [provider]  ?triamcinolone (NASACORT) 55 MCG/ACT AERO  nasal inhaler Place 2 sprays into the nose daily.    [provider]  ? ? ?Family History ?Family History  ?Problem Relation Age of Onset  ? Healthy Mother   ? Healthy Father   ? ? ?Social History ?Social History  ? ?Tobacco Use  ? Smoking status: Never  ?  Passive exposure: Never  ? Smokeless tobacco: Never  ?Vaping Use  ? Vaping Use: Never used  ?Substance Use Topics  ? Alcohol use: Yes  ?  Comment: Occ.  ? Drug use: Never  ? ? ? ?Allergies   ?Patient has no known allergies. ? ? ?Review of Systems ?Review of Systems  ?Constitutional:  Negative for fever.  ?HENT:  Positive for congestion, rhinorrhea and sore throat. Negative for ear pain.   ?Respiratory:  Positive for cough. Negative for shortness of breath and wheezing.   ?Hematological: Negative.   ?Psychiatric/Behavioral: Negative.    ? ? ?Physical Exam ?Triage Vital Signs ?ED Triage Vitals  ?Enc Vitals Group  ?   BP 03/26/22 1923 125/74  ?   Pulse Rate 03/26/22 1923 64  ?   Resp 03/26/22 1923 18  ?   Temp 03/26/22 1923 98.2 ?F (36.8 ?C)  ?   Temp Source 03/26/22 1923 Oral  ?   SpO2 03/26/22 1923 100 %  ?   Weight 03/26/22 1920 160 lb (72.6 kg)  ?   Height 03/26/22 1920 6\' 3"  (1.905 m)  ?   Head Circumference --   ?   Peak Flow --   ?   Pain Score 03/26/22 1916 2  ?   Pain Loc --   ?   Pain Edu? --   ?   Excl. in GC? --   ? ?No data found. ? ?Updated Vital Signs ?BP 125/74 (BP Location: Left Arm)   Pulse 64   Temp 98.2 ?F (36.8 ?C) (Oral)   Resp 18   Ht 6\' 3"  (1.905 m)   Wt 160 lb (72.6 kg)   SpO2 100%   BMI 20.00 kg/m?  ? ?Visual Acuity ?Right Eye Distance:   ?Left Eye Distance:   ?Bilateral Distance:   ? ?Right Eye Near:   ?Left Eye Near:    ?Bilateral Near:    ? ?Physical Exam ?Vitals and nursing note reviewed.  ?Constitutional:   ?   Appearance: Normal appearance. He is not ill-appearing.  ?HENT:  ?   Head: Normocephalic and atraumatic.  ?   Right Ear: Tympanic membrane, ear canal and external ear normal. There is no impacted cerumen.  ?    Left Ear: Tympanic membrane, ear canal and external ear normal. There is no impacted cerumen.  ?   Nose: Congestion and rhinorrhea present.  ?   Mouth/Throat:  ?   Mouth: Mucous membranes are moist.  ?   Pharynx: Oropharynx is clear. Posterior oropharyngeal erythema present. No oropharyngeal exudate.  ?Cardiovascular:  ?   Rate and Rhythm: Normal rate and  regular rhythm.  ?   Pulses: Normal pulses.  ?   Heart sounds: Normal heart sounds. No murmur heard. ?  No friction rub. No gallop.  ?Pulmonary:  ?   Effort: Pulmonary effort is normal.  ?   Breath sounds: Normal breath sounds. No wheezing, rhonchi or rales.  ?Musculoskeletal:  ?   Cervical back: Normal range of motion and neck supple.  ?Lymphadenopathy:  ?   Cervical: No cervical adenopathy.  ?Skin: ?   General: Skin is warm and dry.  ?   Capillary Refill: Capillary refill takes less than 2 seconds.  ?   Findings: No erythema or rash.  ?Neurological:  ?   General: No focal deficit present.  ?   Mental Status: He is alert and oriented to person, place, and time.  ?Psychiatric:     ?   Mood and Affect: Mood normal.     ?   Behavior: Behavior normal.     ?   Thought Content: Thought content normal.     ?   Judgment: Judgment normal.  ? ? ? ?UC Treatments / Results  ?Labs ?(all labs ordered are listed, but only abnormal results are displayed) ?Labs Reviewed  ?CYTOLOGY, (ORAL, ANAL, URETHRAL) ANCILLARY ONLY  ? ? ?EKG ? ? ?Radiology ?No results found. ? ?Procedures ?Procedures (including critical care time) ? ?Medications Ordered in UC ?Medications - No data to display ? ?Initial Impression / Assessment and Plan / UC Course  ?I have reviewed the triage vital signs and the nursing notes. ? ?Pertinent labs & imaging results that were available during my care of the patient were reviewed by me and considered in my medical decision making (see chart for details). ? ?Patient is a pleasant, nontoxic-appearing 21 year old male here for evaluation of sore throat and other  associated upper respiratory symptoms as outlined HPI above.  Patient's physical exam reveals pearly-gray tympanic membranes bilaterally with normal light reflex and clear external auditory canals.  Nasal mucosa is er

## 2022-03-30 LAB — CYTOLOGY, (ORAL, ANAL, URETHRAL) ANCILLARY ONLY
Chlamydia: NEGATIVE
Comment: NEGATIVE
Comment: NEGATIVE
Comment: NORMAL
Neisseria Gonorrhea: NEGATIVE
Trichomonas: NEGATIVE

## 2022-06-25 DIAGNOSIS — R21 Rash and other nonspecific skin eruption: Secondary | ICD-10-CM | POA: Diagnosis not present

## 2022-06-25 DIAGNOSIS — L259 Unspecified contact dermatitis, unspecified cause: Secondary | ICD-10-CM | POA: Diagnosis not present

## 2022-06-29 ENCOUNTER — Ambulatory Visit (HOSPITAL_BASED_OUTPATIENT_CLINIC_OR_DEPARTMENT_OTHER): Payer: BC Managed Care – PPO | Admitting: Family Medicine

## 2022-07-15 DIAGNOSIS — L42 Pityriasis rosea: Secondary | ICD-10-CM | POA: Diagnosis not present

## 2022-07-28 ENCOUNTER — Encounter (HOSPITAL_BASED_OUTPATIENT_CLINIC_OR_DEPARTMENT_OTHER): Payer: Self-pay | Admitting: Family Medicine

## 2022-07-28 ENCOUNTER — Ambulatory Visit (INDEPENDENT_AMBULATORY_CARE_PROVIDER_SITE_OTHER): Payer: BC Managed Care – PPO | Admitting: Family Medicine

## 2022-07-28 VITALS — BP 127/76 | HR 73 | Temp 97.8°F | Ht 75.0 in | Wt 175.5 lb

## 2022-07-28 DIAGNOSIS — Z0000 Encounter for general adult medical examination without abnormal findings: Secondary | ICD-10-CM

## 2022-07-28 DIAGNOSIS — R21 Rash and other nonspecific skin eruption: Secondary | ICD-10-CM

## 2022-07-28 DIAGNOSIS — Z23 Encounter for immunization: Secondary | ICD-10-CM | POA: Diagnosis not present

## 2022-07-28 DIAGNOSIS — G43909 Migraine, unspecified, not intractable, without status migrainosus: Secondary | ICD-10-CM

## 2022-07-28 HISTORY — DX: Rash and other nonspecific skin eruption: R21

## 2022-07-28 MED ORDER — TRIAMCINOLONE ACETONIDE 0.1 % EX CREA: 1.0000 | TOPICAL_CREAM | Freq: Two times a day (BID) | CUTANEOUS | 1 refills | Status: DC | PRN

## 2022-07-28 NOTE — Progress Notes (Signed)
New Patient Office Visit  Subjective    Patient ID: Melvin Kelly, male    DOB: 07-02-2001  Age: 21 y.o. MRN: [id number]  CC:  Chief Complaint  Patient presents with   New Patient (Initial Visit)    Pt here to establish new care, pt would also like to get a referral for allergy testing     HPI Melvin Kelly presents to establish care Last PCP - Dr. Maryjane Hurter, last visit 2-3 years ago  Migraines: was seeing Neurology, currently taking Emgality.  He reports that he has been doing well with this medication.  Was previously following with a neurologist, has not established with 1 locally, is interested in establishing with new Neurologist locally.  PReP therapy: has been taking Truvada for about 2-3 years. Denies any issues with the medication.  This was being prescribed by his prior PCP  Pruritis: for abobut 2 months he has had intense itching along arms and over trunk, some all proximal lower extremities. Has had courses of steroid with some mild relief when taking the medication, but symptoms will return once stopping medication. No shortness of breath with presence of rash. No fevers or chills, has had some night sweats.  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  Patient is originally from [place], [place]. Moved here earlier this year. Patient is in school at [employer] - for [employer]; he is also working as Equities trader at [employer]. [employer], hiking.  Outpatient Encounter Medications as of 07/28/2022  Medication Sig   EMGALITY 120 MG/ML SOAJ SMARTSIG:120 Milligram(s) SUB-Q Once a Month   emtricitabine-tenofovir (TRUVADA) 200-300 MG tablet Take 1 tablet by mouth daily.   Galcanezumab-gnlm 120 MG/ML SOAJ Inject into the skin.   predniSONE (STERAPRED UNI-PAK 21 TAB) 10 MG (21) TBPK tablet Take by mouth.   triamcinolone cream (KENALOG) 0.1 % Apply 1 Application topically 2 (two) times daily as needed (itching).   [DISCONTINUED]  benzonatate (TESSALON) 100 MG capsule Take 2 capsules (200 mg total) by mouth every 8 (eight) hours.   [DISCONTINUED] butalbital-acetaminophen-caffeine (FIORICET) 50-325-40 MG tablet Take 1 tablet by mouth every 6 (six) hours as needed for headache.   [DISCONTINUED] citalopram (CELEXA) 10 MG tablet Take 10 mg by mouth daily.   [DISCONTINUED] citalopram (CELEXA) 20 MG tablet Take 1 tablet by mouth daily.   [DISCONTINUED] ipratropium (ATROVENT) 0.06 % nasal spray Place 2 sprays into both nostrils 4 (four) times daily.   [DISCONTINUED] montelukast (SINGULAIR) 10 MG tablet Take 10 mg by mouth at bedtime.   [DISCONTINUED] nortriptyline (PAMELOR) 10 MG capsule Take 20 mg by mouth at bedtime.   [DISCONTINUED] promethazine (PHENERGAN) 25 MG tablet Take 1 tablet (25 mg total) by mouth every 8 (eight) hours as needed for nausea or vomiting.   [DISCONTINUED] rizatriptan (MAXALT-MLT) 10 MG disintegrating tablet Take by mouth.   [DISCONTINUED] SUMAtriptan (IMITREX) 100 MG tablet Take 50-100mg  at headache onset. May take a second dose after 2 hours if needed.   [DISCONTINUED] topiramate (TOPAMAX) 25 MG tablet Take 50mg  nightly and continue this dose.   [DISCONTINUED] triamcinolone (NASACORT) 55 MCG/ACT AERO nasal inhaler Place 2 sprays into the nose daily.   No facility-administered encounter medications on file as of 07/28/2022.    Past Medical History:  Diagnosis Date   Headache    sinus(?) - 3-4x/week    Past Surgical History:  Procedure Laterality Date   APPENDECTOMY     age 55   ENDOSCOPIC CONCHA BULLOSA RESECTION  Bilateral 06/15/2019   Procedure: ENDOSCOPIC CONCHA BULLOSA RESECTION;  Surgeon: Melvin Murders, MD;  Location[facility];  Service: ENT;  Laterality: Bilateral;   NASAL SEPTOPLASTY W/ TURBINOPLASTY Bilateral 06/15/2019   Procedure: NASAL SEPTOPLASTY WITH INFERIOR TURBINATE REDUCTION;  Surgeon: Melvin Murders, MD;  Location: [facility];  Service: ENT;  Laterality:  Bilateral;   TONSILLECTOMY Bilateral 03/27/2021   Procedure: TONSILLECTOMY POSSIBLE ADENOID;  Surgeon: Melvin Murders, MD;  Location: [facility];  Service: ENT;  Laterality: Bilateral;  no adenoid tissue found   TONSILLECTOMY     WISDOM TOOTH EXTRACTION     age 80    Family History  Problem Relation Age of Onset   Healthy Mother    Healthy Father     Social History   Socioeconomic History   Marital status: Single    Spouse name: Not on file   Number of children: Not on file   Years of education: Not on file   Highest education level: Not on file  Occupational History   Not on file  Tobacco Use   Smoking status: Never    Passive exposure: Never   Smokeless tobacco: Never  Vaping Use   Vaping Use: Never used  Substance and Sexual Activity   Alcohol use: Yes    Comment: Occ.   Drug use: Never   Sexual activity: Yes    Comment: Last encounter: Orally only, [id number]  Other Topics Concern   Not on file  Social History Narrative   Not on file   Social Determinants of Health   Financial Resource Strain: Not on file  Food Insecurity: Not on file  Transportation Needs: Not on file  Physical Activity: Not on file  Stress: Not on file  Social Connections: Not on file  Intimate Partner Violence: Not on file    Objective    BP 127/76   Pulse 73   Temp 97.8 F (36.6 C) (Oral)   Ht 6\' 3"  (1.905 m)   Wt 175 lb 8 oz (79.6 kg)   SpO2 97%   BMI 21.94 kg/m   Physical Exam  21 year old male in no acute distress Cardiovascular exam regular rate and rhythm, no murmur appreciated Lungs clear to auscultation bilaterally Patient has scattered erythematous maculopapular rash noted over trunk, less over proximal extremities.  No drainage or discharge present  Assessment & Plan:   Problem List Items Addressed This Visit       Cardiovascular and Mediastinum   Migraine without status migrainosus, not intractable    Can continue with current medication regimen We  will refer patient to neurologist locally for him to establish      Relevant Orders   Ambulatory referral to Neurology     Musculoskeletal and Integument   Rash - Primary    History and exam do appear consistent with pityriasis rosea.  He has been on various courses of oral steroids with some improvement while taking the medication.  Continues to have notable pruritus at times.  He is requesting referral to allergist today allergic reaction Discussed that rash more likely is related to pityriasis rosea and would recommend continued management as such.  Given continued itching, we will treat with topical steroid at this time to try and help with symptoms At patient's request, we can also proceed with referral to allergist for further evaluation.  Given timeframe of about 2 months thus far, would expect rash to improve over the coming weeks/month if rash has resolved before appointment with specialist,  likely can proceed with monitoring and evaluation with allergist to ensure no other potential underlying cause      Relevant Medications   triamcinolone cream (KENALOG) 0.1 %   Other Relevant Orders   Ambulatory referral to Allergy   Other Visit Diagnoses     Need for influenza vaccination       Relevant Orders   Flu Vaccine QUAD 6+ mos PF IM (Fluarix Quad PF) (Completed)   Wellness examination       Relevant Orders   CBC with Differential/Platelet   Comprehensive metabolic panel   Lipid panel   TSH Rfx on Abnormal to Free T4       Return in about 6 weeks (around 09/08/2022) for CPE with [facility] a few days prior.   Aldine Grainger J De [place], MD

## 2022-07-28 NOTE — Patient Instructions (Signed)
  Medication Instructions:  Your physician recommends that you continue on your current medications as directed. Please refer to the Current Medication list given to you today. --If you need a refill on any your medications before your next appointment, please call your pharmacy first. If no refills are authorized on file call the office.-- Lab Work: Your physician has recommended that you have lab work today: No If you have labs (blood work) drawn today and your tests are completely normal, you will receive your results via MyChart message OR a phone call from our staff.  Please ensure you check your voicemail in the event that you authorized detailed messages to be left on a delegated number. If you have any lab test that is abnormal or we need to change your treatment, we will call you to review the results.  Referrals/Procedures/Imaging: No  Follow-Up: Your next appointment:   Your physician recommends that you schedule a follow-up appointment in: 1-2 months cpe with Dr. de Cuba. Nurse visit 1 week before for labs.   You will receive a text message or e-mail with a link to a survey about your care and experience with us today! We would greatly appreciate your feedback!   Thanks for letting us be apart of your health journey!!  Primary Care and Sports Medicine   Dr. Raymond de Cuba   We encourage you to activate your patient portal called "MyChart".  Sign up information is provided on this After Visit Summary.  MyChart is used to connect with patients for Virtual Visits (Telemedicine).  Patients are able to view lab/test results, encounter notes, upcoming appointments, etc.  Non-urgent messages can be sent to your provider as well. To learn more about what you can do with MyChart, please visit --  [email or web address].    

## 2022-07-28 NOTE — Assessment & Plan Note (Signed)
Can continue with current medication regimen We will refer patient to neurologist locally for him to establish

## 2022-07-28 NOTE — Assessment & Plan Note (Signed)
History and exam do appear consistent with pityriasis rosea.  He has been on various courses of oral steroids with some improvement while taking the medication.  Continues to have notable pruritus at times.  He is requesting referral to allergist today allergic reaction Discussed that rash more likely is related to pityriasis rosea and would recommend continued management as such.  Given continued itching, we will treat with topical steroid at this time to try and help with symptoms At patient's request, we can also proceed with referral to allergist for further evaluation.  Given timeframe of about 2 months thus far, would expect rash to improve over the coming weeks/month if rash has resolved before appointment with specialist, likely can proceed with monitoring and evaluation with allergist to ensure no other potential underlying cause

## 2022-09-01 ENCOUNTER — Ambulatory Visit (HOSPITAL_BASED_OUTPATIENT_CLINIC_OR_DEPARTMENT_OTHER): Payer: BC Managed Care – PPO

## 2022-09-01 DIAGNOSIS — Z0000 Encounter for general adult medical examination without abnormal findings: Secondary | ICD-10-CM

## 2022-09-02 LAB — COMPREHENSIVE METABOLIC PANEL
ALT: 12 IU/L (ref 0–44)
AST: 16 IU/L (ref 0–40)
Albumin/Globulin Ratio: 2.7 — ABNORMAL HIGH (ref 1.2–2.2)
Albumin: 5.2 g/dL (ref 4.3–5.2)
Alkaline Phosphatase: 80 IU/L (ref 44–121)
BUN/Creatinine Ratio: 9 (ref 9–20)
BUN: 9 mg/dL (ref 6–20)
Bilirubin Total: 0.4 mg/dL (ref 0.0–1.2)
CO2: 20 mmol/L (ref 20–29)
Calcium: 9.7 mg/dL (ref 8.7–10.2)
Chloride: 103 mmol/L (ref 96–106)
Creatinine, Ser: 1.03 mg/dL (ref 0.76–1.27)
Globulin, Total: 1.9 g/dL (ref 1.5–4.5)
Glucose: 90 mg/dL (ref 70–99)
Potassium: 4.2 mmol/L (ref 3.5–5.2)
Sodium: 142 mmol/L (ref 134–144)
Total Protein: 7.1 g/dL (ref 6.0–8.5)
eGFR: 105 mL/min/{1.73_m2} (ref >59)

## 2022-09-02 LAB — CBC WITH DIFFERENTIAL/PLATELET
Basophils Absolute: 0.1 10*3/uL (ref 0.0–0.2)
Basos: 1 %
EOS (ABSOLUTE): 0.6 10*3/uL — ABNORMAL HIGH (ref 0.0–0.4)
Eos: 9 %
Hematocrit: 41.2 % (ref 37.5–51.0)
Hemoglobin: 14.3 g/dL (ref 13.0–17.7)
Immature Grans (Abs): 0 10*3/uL (ref 0.0–0.1)
Immature Granulocytes: 0 %
Lymphocytes Absolute: 2.1 10*3/uL (ref 0.7–3.1)
Lymphs: 27 %
MCH: 28.8 pg (ref 26.6–33.0)
MCHC: 34.7 g/dL (ref 31.5–35.7)
MCV: 83 fL (ref 79–97)
Monocytes Absolute: 0.6 10*3/uL (ref 0.1–0.9)
Monocytes: 8 %
Neutrophils Absolute: 4.1 10*3/uL (ref 1.4–7.0)
Neutrophils: 55 %
Platelets: 272 10*3/uL (ref 150–450)
RBC: 4.97 x10E6/uL (ref 4.14–5.80)
RDW: 11.9 % (ref 11.6–15.4)
WBC: 7.5 10*3/uL (ref 3.4–10.8)

## 2022-09-02 LAB — LIPID PANEL
Chol/HDL Ratio: 3.8 ratio (ref 0.0–5.0)
Cholesterol, Total: 163 mg/dL (ref 100–199)
HDL: 43 mg/dL (ref >39)
LDL Chol Calc (NIH): 90 mg/dL (ref 0–99)
Triglycerides: 172 mg/dL — ABNORMAL HIGH (ref 0–149)
VLDL Cholesterol Cal: 30 mg/dL (ref 5–40)

## 2022-09-02 LAB — TSH RFX ON ABNORMAL TO FREE T4: TSH: 0.707 u[IU]/mL (ref 0.450–4.500)

## 2022-09-08 ENCOUNTER — Encounter (HOSPITAL_BASED_OUTPATIENT_CLINIC_OR_DEPARTMENT_OTHER): Payer: Self-pay | Admitting: Family Medicine

## 2022-09-08 ENCOUNTER — Ambulatory Visit (INDEPENDENT_AMBULATORY_CARE_PROVIDER_SITE_OTHER): Payer: BC Managed Care – PPO | Admitting: Family Medicine

## 2022-09-08 DIAGNOSIS — Z0000 Encounter for general adult medical examination without abnormal findings: Secondary | ICD-10-CM | POA: Insufficient documentation

## 2022-09-08 NOTE — Assessment & Plan Note (Signed)
Routine HCM labs reviewed. HCM reviewed/discussed. Anticipatory guidance regarding healthy weight, lifestyle and choices given. Recommend healthy diet.  Recommend approximately 150 minutes/week of moderate intensity exercise Recommend regular dental and vision exams Always use seatbelt/lap and shoulder restraints Recommend using smoke alarms and checking batteries at least twice a year Recommend using sunscreen when outside [facility] blood cell and red blood cell counts are normal with normal hemoglobin.  Electrolytes, kidney function and liver function are normal. Discussed tetanus immunization recommendations, patient is UTD

## 2022-09-08 NOTE — Progress Notes (Signed)
Subjective:    CC: Annual Physical Exam  HPI:  Melvin Kelly is a 21 y.o. presenting for annual physical  I reviewed the past medical history, family history, social history, surgical history, and allergies today and no changes were needed.  Please see the problem list section below in epic for further details.  Past Medical History: Past Medical History:  Diagnosis Date   Headache    sinus(?) - 3-4x/week   Past Surgical History: Past Surgical History:  Procedure Laterality Date   APPENDECTOMY     age 73   ENDOSCOPIC CONCHA BULLOSA RESECTION Bilateral 06/15/2019   Procedure: ENDOSCOPIC CONCHA BULLOSA RESECTION;  Surgeon: [name], MD;  Location: [facility];  Service: ENT;  Laterality: Bilateral;   NASAL SEPTOPLASTY W/ TURBINOPLASTY Bilateral 06/15/2019   Procedure: NASAL SEPTOPLASTY WITH INFERIOR TURBINATE REDUCTION;  Surgeon: [name], MD;  Location: [facility];  Service: ENT;  Laterality: Bilateral;   TONSILLECTOMY Bilateral 03/27/2021   Procedure: TONSILLECTOMY POSSIBLE ADENOID;  Surgeon: [name], MD;  Location: [facility];  Service: ENT;  Laterality: Bilateral;  no adenoid tissue found   TONSILLECTOMY     WISDOM TOOTH EXTRACTION     age 12   Social History: Social History   Socioeconomic History   Marital status: Single    Spouse name: Not on file   Number of children: Not on file   Years of education: Not on file   Highest education level: Not on file  Occupational History   Not on file  Tobacco Use   Smoking status: Never    Passive exposure: Never   Smokeless tobacco: Never  Vaping Use   Vaping Use: Never used  Substance and Sexual Activity   Alcohol use: Yes    Comment: Occ.   Drug use: Never   Sexual activity: Yes    Comment: Last encounter: Orally only, [id number]  Other Topics Concern   Not on file  Social History Narrative   Not on file   Social Determinants of Health   Financial Resource Strain: Not  on file  Food Insecurity: Not on file  Transportation Needs: Not on file  Physical Activity: Not on file  Stress: Not on file  Social Connections: Not on file   Family History: Family History  Problem Relation Age of Onset   Healthy Mother    Healthy Father    Allergies: No Known Allergies Medications: See med rec.  Review of Systems: No headache, visual changes, nausea, vomiting, diarrhea, constipation, dizziness, abdominal pain, skin rash, fevers, chills, night sweats, swollen lymph nodes, weight loss, chest pain, body aches, joint swelling, muscle aches, shortness of breath, mood changes, visual or auditory hallucinations.  Objective:    BP (!) 141/74   Pulse 71   Temp 97.8 F (36.6 C) (Oral)   Ht 6\' 3"  (1.905 m)   Wt 181 lb 9.6 oz (82.4 kg)   SpO2 100%   BMI 22.70 kg/m   General: Well Developed, well nourished, and in no acute distress. Neuro: Alert and oriented x3, extra-ocular muscles intact, sensation grossly intact. Cranial nerves II through XII are intact, motor, sensory, and coordinative functions are all intact. HEENT: Normocephalic, atraumatic, pupils equal round reactive to light, neck supple, no masses, no lymphadenopathy, thyroid nonpalpable. Oropharynx, nasopharynx, external ear canals are unremarkable. Skin: Warm and dry, no rashes noted. Cardiac: Regular rate and rhythm, no murmurs rubs or gallops. Respiratory: Clear to auscultation bilaterally. Not using accessory muscles, speaking in full sentences. Abdominal:  Soft, nontender, nondistended, positive bowel sounds, no masses, no organomegaly. Musculoskeletal: Shoulder, elbow, wrist, hip, knee, ankle stable, and with full range of motion.  Impression and Recommendations:    Wellness examination Routine HCM labs reviewed. HCM reviewed/discussed. Anticipatory guidance regarding healthy weight, lifestyle and choices given. Recommend healthy diet.  Recommend approximately 150 minutes/week of moderate intensity  exercise Recommend regular dental and vision exams Always use seatbelt/lap and shoulder restraints Recommend using smoke alarms and checking batteries at least twice a year Recommend using sunscreen when outside [facility] blood cell and red blood cell counts are normal with normal hemoglobin.  Electrolytes, kidney function and liver function are normal. Discussed tetanus immunization recommendations, patient is UTD  Bloating -reports that this has been going on for several months, however has been more noticeable recently partly due to duration of symptoms.  Has not necessarily had change in bowel movements with this.  Not sure of any association with specific foods.  Has not had any blood in the stool.  No nausea or vomiting. He did have recent labs which were generally reassuring, no evidence of liver dysfunction or gallbladder disease.  Physical exam as above today, no abnormalities noted on abdominal exam. At this time, we will proceed with conservative measures, discussed low-fat map diet, provided handout today.  Encouraged trial elimination of potential dietary culprits.  We will plan for follow-up in about 6 weeks to assess progress, if continuing to have symptoms, consider referral to GI  Return in about 6 weeks (around 10/20/2022), or if symptoms worsen or fail to improve, for bloating.   ___________________________________________ Callahan Peddie de [place], MD, ABFM, [facility] : [facility] [place]

## 2022-09-08 NOTE — Patient Instructions (Signed)
  Medication Instructions:  Your physician recommends that you continue on your current medications as directed. Please refer to the Current Medication list given to you today. --If you need a refill on any your medications before your next appointment, please call your pharmacy first. If no refills are authorized on file call the office.-- Lab Work: Your physician has recommended that you have lab work today: No If you have labs (blood work) drawn today and your tests are completely normal, you will receive your results via MyChart message OR a phone call from our staff.  Please ensure you check your voicemail in the event that you authorized detailed messages to be left on a delegated number. If you have any lab test that is abnormal or we need to change your treatment, we will call you to review the results.  Referrals/Procedures/Imaging: No  Follow-Up: Your next appointment:   Your physician recommends that you schedule a follow-up appointment in: 6-8 weeks with Dr. de Cuba.  You will receive a text message or e-mail with a link to a survey about your care and experience with us today! We would greatly appreciate your feedback!   Thanks for letting us be apart of your health journey!!  Primary Care and Sports Medicine   Dr. Raymond de Cuba   We encourage you to activate your patient portal called "MyChart".  Sign up information is provided on this After Visit Summary.  MyChart is used to connect with patients for Virtual Visits (Telemedicine).  Patients are able to view lab/test results, encounter notes, upcoming appointments, etc.  Non-urgent messages can be sent to your provider as well. To learn more about what you can do with MyChart, please visit --  [email or web address].    

## 2022-10-15 ENCOUNTER — Encounter: Payer: Self-pay | Admitting: Allergy & Immunology

## 2022-10-15 ENCOUNTER — Ambulatory Visit (INDEPENDENT_AMBULATORY_CARE_PROVIDER_SITE_OTHER): Payer: BC Managed Care – PPO | Admitting: Allergy & Immunology

## 2022-10-15 VITALS — BP 100/62 | HR 68 | Temp 98.6°F | Resp 18 | Ht 75.0 in | Wt 182.9 lb

## 2022-10-15 DIAGNOSIS — L300 Nummular dermatitis: Secondary | ICD-10-CM | POA: Diagnosis not present

## 2022-10-15 DIAGNOSIS — R21 Rash and other nonspecific skin eruption: Secondary | ICD-10-CM

## 2022-10-15 DIAGNOSIS — L239 Allergic contact dermatitis, unspecified cause: Secondary | ICD-10-CM | POA: Diagnosis not present

## 2022-10-15 DIAGNOSIS — J301 Allergic rhinitis due to pollen: Secondary | ICD-10-CM | POA: Diagnosis not present

## 2022-10-15 DIAGNOSIS — L500 Allergic urticaria: Secondary | ICD-10-CM | POA: Diagnosis not present

## 2022-10-15 NOTE — Progress Notes (Signed)
NEW PATIENT  Date of Service/Encounter:  10/15/22  Consult requested by: de [place], Raymond J, MD   Assessment:   Rash  Seasonal allergic rhinitis due to pollen (grasses)  Migraines - treated with Emgality  Plan/Recommendations:   1. Rash - I would love some pics of the rash when you get a chance. - I am not convinced of the pityriasis diagnosis since this is typically not very itchy. - We are going to get some labs to rule out weird causes of rash. - Testing was only positive to grasses, which would not fully explain the symptoms (especially in light of the fact that you do not spend a lot of time outdoors). - We can send in a stronger steroid ointment the next time that this happens. - I would like to see you when or if this pops up again.  - Testing was negative to the most common foods, which rules out > 95% of all food allergies.   2. Seasonal allergic rhinitis due to pollen - Testing today showed: grasses. - Copy of test results provided.  - Avoidance measures provided. - Continue with: an antihistamine as needed  3. Return in about 6 months (around 04/15/2023).     This note in its entirety was forwarded to the Provider who requested this consultation.  Subjective:   Melvin Kelly is a 21 y.o. male presenting today for evaluation of  Chief Complaint  Patient presents with   Allergy Testing   Rash    Melvin Kelly has a history of the following: Patient Active Problem List   Diagnosis Date Noted   Wellness examination 09/08/2022   Rash 07/28/2022   Blurred vision 07/17/2021   Headache disorder 07/17/2021   Nausea and vomiting 07/17/2021   Migraine without status migrainosus, not intractable 07/21/2018    History obtained from: chart review and patient.  Melvin Kelly was referred by de Peru, Buren Kos, MD.     [name] is a 21 y.o. male presenting for an evaluation of a rash . He grew up in [place]. He is going to [employer].   He had a rash that  lasted for several months. It was down his side and up his arms. It was very pruritus. They did not know what was wrong. He had prednisone for two weeks that did not help. Steroid ointment did not help. He was diagnosed with pityriasis, but although he reports that it was itchy. It finally resolved last month. The rash felt like dry patches. It did feel slightly raised.   It has cleared up completely at this point. Skin became normal again. 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 He denies any traveling at the time. He was going to [employer].  He has been around cats when he moved here.   He was able to show me a picture which did show some lesions that did look remarkably like pityriasis rosea on his right flank. There are some excoriations present.  It seems that he was put on triamcinolone which she said did not help.  Prednisone did not help either.  He had tonsillectomy performed in May 2022.  He was on PrEP in the past, but no longer is on that.  He has a history of migraines and is on Emgality once a month.  Otherwise, there is no history of other atopic diseases, including asthma, food allergies, drug allergies, environmental allergies, stinging insect allergies, or contact dermatitis. There  is no significant infectious history. Vaccinations are up to date.    Past Medical History: Patient Active Problem List   Diagnosis Date Noted   Wellness examination 09/08/2022   Rash 07/28/2022   Blurred vision 07/17/2021   Headache disorder 07/17/2021   Nausea and vomiting 07/17/2021   Migraine without status migrainosus, not intractable 07/21/2018    Medication List:  Allergies as of 10/15/2022   No Known Allergies      Medication List        Accurate as of October 15, 2022  5:00 PM. If you have any questions, ask your nurse or doctor.          STOP taking these medications    emtricitabine-tenofovir 200-300 MG tablet Commonly known  as: TRUVADA Stopped by: Alfonse SpruceJoel Louis Arsema Tusing, MD   predniSONE 10 MG (21) Tbpk tablet Commonly known as: STERAPRED UNI-PAK 21 TAB Stopped by: Alfonse SpruceJoel Louis Brevon Dewald, MD       TAKE these medications    Emgality 120 MG/ML Soaj Generic drug: Galcanezumab-gnlm SMARTSIG:120 Milligram(s) SUB-Q Once a Month What changed: Another medication with the same name was removed. Continue taking this medication, and follow the directions you see here. Changed by: Alfonse SpruceJoel Louis Franny Selvage, MD   triamcinolone cream 0.1 % Commonly known as: KENALOG Apply 1 Application topically 2 (two) times daily as needed (itching).        Birth History: non-contributory  Developmental History: non-contributory  Past Surgical History: Past Surgical History:  Procedure Laterality Date   APPENDECTOMY     age 89   ENDOSCOPIC CONCHA BULLOSA RESECTION Bilateral 06/15/2019   Procedure: ENDOSCOPIC CONCHA BULLOSA RESECTION;  Surgeon: Vernie MurdersJuengel, Paul, MD;  [facility];  Service: ENT;  Laterality: Bilateral;   NASAL SEPTOPLASTY W/ TURBINOPLASTY Bilateral 06/15/2019   Procedure: NASAL SEPTOPLASTY WITH INFERIOR TURBINATE REDUCTION;  Surgeon: Vernie MurdersJuengel, Paul, MD;  [facility];  Service: ENT;  Laterality: Bilateral;   SINOSCOPY     TONSILLECTOMY Bilateral 03/27/2021   Procedure: TONSILLECTOMY POSSIBLE ADENOID;  Surgeon: Vernie MurdersJuengel, Paul, MD;  [facility];  Service: ENT;  Laterality: Bilateral;  no adenoid tissue found   TONSILLECTOMY     WISDOM TOOTH EXTRACTION     age 21     Family History: Family History  Problem Relation Age of Onset   Allergic rhinitis Mother    Healthy Mother    Allergic rhinitis Father    Healthy Father    Allergic rhinitis Sister    Eczema Sister    Allergic rhinitis Brother      Social History: [address] [place] lives at home with his roommate.  He [place] room[place] in [place].  There is carpeting throughout the  apartment.  They have gas heating and central cooling.  There are 2 cats inside of the home.  There are no dust mite covers on the bedding.  He currently is getting a degree in radiography from [employer].  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  He does use a HEPA filter in the home.  [place] does [place] an interstate or industrial area.   Review of Systems  Constitutional: Negative.  Negative for chills, fever, malaise/fatigue and weight loss.  HENT: Negative.  Negative for congestion, ear discharge and ear pain.   Eyes:  Negative for pain, discharge and redness.  Respiratory:  Negative  for cough, sputum production, shortness of breath and wheezing.   Cardiovascular: Negative.  Negative for chest pain and palpitations.  Gastrointestinal:  Negative for abdominal pain, constipation, diarrhea, heartburn, nausea and vomiting.  Skin:  Positive for itching and rash.  Neurological:  Negative for dizziness and headaches.  Endo/Heme/Allergies:  Positive for environmental allergies. Does not bruise/bleed easily.       Objective:   Blood pressure 100/62, pulse 68, temperature 98.6 F (37 C), temperature source Temporal, resp. rate 18, height 6\' 3"  (1.905 m), weight 182 lb 14.4 oz (83 kg), SpO2 97 %. Body mass index is 22.86 kg/m.     Physical Exam Constitutional:      Appearance: He is well-developed.  HENT:     Head: Normocephalic and atraumatic.     Right Ear: Tympanic membrane, ear canal and external ear normal. No drainage, swelling or tenderness. Tympanic membrane is not injected, scarred, erythematous, retracted or bulging.     Left Ear: Tympanic membrane, ear canal and external ear normal. No drainage, swelling or tenderness. Tympanic membrane is not injected, scarred, erythematous, retracted or bulging.     Nose: No nasal deformity, septal deviation, mucosal edema  or rhinorrhea.     Right Turbinates: Enlarged and swollen.     Left Turbinates: Enlarged and swollen.     Right Sinus: No maxillary sinus tenderness or frontal sinus tenderness.     Left Sinus: No maxillary sinus tenderness or frontal sinus tenderness.     Mouth/Throat:     Mouth: Mucous membranes are not pale and not dry.     Pharynx: Uvula midline.  Eyes:     General: Lids are normal. Allergic shiner present.        Right eye: No discharge.        Left eye: No discharge.     Conjunctiva/sclera: Conjunctivae normal.     Right eye: Right conjunctiva is not injected. No chemosis.    Left eye: Left conjunctiva is not injected. No chemosis.    Pupils: Pupils are equal, round, and reactive to light.  Cardiovascular:     Rate and Rhythm: Normal rate and regular rhythm.     Heart sounds: Normal heart sounds.  Pulmonary:     Effort: Pulmonary effort is normal. No tachypnea, accessory muscle usage or respiratory distress.     Breath sounds: Normal breath sounds. No wheezing, rhonchi or rales.  Chest:     Chest wall: No tenderness.  Abdominal:     Tenderness: There is no abdominal tenderness. There is no guarding or rebound.  Lymphadenopathy:     Head:     Right side of head: No submandibular, tonsillar or occipital adenopathy.     Left side of head: No submandibular, tonsillar or occipital adenopathy.     Cervical: No cervical adenopathy.  Skin:    General: Skin is warm.     Capillary Refill: Capillary refill takes less than 2 seconds.     Coloration: Skin is not pale.     Findings: No abrasion, erythema, petechiae or rash. Rash is not papular, urticarial or vesicular.     Comments: Rash is fully resolved.  No residual marks.  No excoriations.  Neurological:     Mental Status: He is alert.      Diagnostic studies:   Allergy Studies:     Airborne Adult Perc - 10/15/22 1525     Time Antigen Placed 1525    Allergen Manufacturer 10/17/22    Location Back  Number of Test 59     Panel 1 Select    1. Control-Buffer 50% Glycerol Negative    2. Control-Histamine 1 mg/ml 2+    3. Albumin saline Negative    4. Bahia Negative    5. French Southern Territories Negative    6. Johnson Negative    7. Kentucky Blue 2+    8. Meadow Fescue 2+    9. Perennial Rye 2+    10. Sweet Vernal 2+    11. Timothy 2+    12. Cocklebur Negative    13. Burweed Marshelder Negative    14. Ragweed, short Negative    15. Ragweed, Giant Negative    16. Plantain,  English Negative    17. Lamb's Quarters Negative    18. Sheep Sorrell Negative    19. Rough Pigweed Negative    20. Marsh Elder, Rough Negative    21. Mugwort, Common Negative    22. Ash mix Negative    23. Birch mix Negative    24. Beech American Negative    25. Box, Elder Negative    26. Cedar, red Negative    27. Cottonwood, Guinea-Bissau Negative    28. Elm mix Negative    29. Hickory Negative    30. Maple mix Negative    31. Oak, Guinea-Bissau mix Negative    32. Pecan Pollen Negative    33. Pine mix Negative    34. Sycamore Eastern Negative    35. Walnut, Black Pollen Negative    36. Alternaria alternata Negative    37. Cladosporium Herbarum Negative    38. Aspergillus mix Negative    39. Penicillium mix Negative    40. Bipolaris sorokiniana (Helminthosporium) Negative    41. Drechslera spicifera (Curvularia) Negative    42. Mucor plumbeus Negative    43. Fusarium moniliforme Negative    44. Aureobasidium pullulans (pullulara) Negative    45. Rhizopus oryzae Negative    46. Botrytis cinera Negative    47. Epicoccum nigrum Negative    48. Phoma betae Negative    49. Candida Albicans Negative    50. Trichophyton mentagrophytes Negative    51. Mite, D Farinae  5,000 AU/ml Negative    52. Mite, D Pteronyssinus  5,000 AU/ml Negative    53. Cat Hair 10,000 BAU/ml Negative    54.  Dog Epithelia Negative    55. Mixed Feathers Negative    56. Horse Epithelia Negative    57. Cockroach, German Negative    58. Mouse Negative    59. Tobacco  Leaf Negative             Food Perc - 10/15/22 1525       Test Information   Time Antigen Placed 1525    Allergen Manufacturer Waynette Buttery    Location Back    Number of allergen test 10    Food Select      Food   1. Peanut Negative    2. Soybean food Negative    3. Wheat, whole Negative    4. Sesame Negative    5. Milk, cow Negative    6. Egg White, chicken Negative    7. Casein Negative    8. Shellfish mix Negative    9. Fish mix Negative    10. Cashew Negative             Allergy testing results were read and interpreted by myself, documented by clinical staff.         Malachi Bonds,  MD [facility]

## 2022-10-15 NOTE — Patient Instructions (Addendum)
1. Rash - I would love some pics of the rash when you get a chance. - I am not convinced of the pityriasis diagnosis since this is typically not very itchy. - We are going to get some labs to rule out weird causes of rash. - Testing was only positive to grasses, which would not fully explain the symptoms (especially in light of the fact that you do not spend a lot of time outdoors). - We can send in a strong steroid ointment the next time that this happens. - I would like to see you when or if this pops up again.  - Testing was negative to the most common foods, which rules out > 95% of all food allergies.   2. Seasonal allergic rhinitis due to pollen - Testing today showed: grasses. - Copy of test results provided.  - Avoidance measures provided. - Continue with: an antihistamine as needed  3. Return in about 6 months (around 04/15/2023).    Please inform us of any Emergency Department visits, hospitalizations, or changes in symptoms. Call us before going to the ED for breathing or allergy symptoms since we might be able to fit you in for a sick visit. Feel free to contact us anytime with any questions, problems, or concerns.  It was a pleasure to meet you today! 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   Websites that have reliable patient information: 1. American Academy of Asthma, Allergy, and Immunology: www.aaaai.org 2. Food Allergy Research and Education (FARE): foodallergy.org 3. Mothers of Asthmatics: http://www.asthmacommunitynetwork.org 4. American College of Allergy, Asthma, and Immunology: www.acaai.org   COVID-19 Vaccine Information can be found at: [email or web address] For questions related to vaccine distribution or appointments, please email [email or web address] or call [phone].   We realize that you might be concerned about having an allergic reaction to the COVID19 vaccines. To help with that concern, WE  ARE OFFERING THE COVID19 VACCINES IN OUR OFFICE! Ask the front desk for dates!     "Like" [place] on Facebook and Instagram for our latest updates!      A healthy democracy works best when [employer] participate! Make sure you are registered to vote! If you have moved or changed any of your contact information, you will need to get this updated before voting!  In some cases, you MAY be able to register to vote online: [email or web address]        Airborne Adult Perc - 10/15/22 1525     Time Antigen Placed 1525    Allergen Manufacturer Waynette Buttery    Location Back    Number of Test 59    Panel 1 Select    1. Control-Buffer 50% Glycerol Negative    2. Control-Histamine 1 mg/ml 2+    3. Albumin saline Negative    4. Bahia Negative    5. French Southern Territ[place] Negative    6. Johnson Negative    7. [place] Blue 2+    8. Meadow Fescue 2+    9. Perennial Rye 2+    10. Sweet Vernal 2+    11. Timothy 2+    12. Cocklebur Negative    13. Burweed Marshelder Negative    14. Ragweed, short Negative    15. Ragweed, Giant Negative    16. Plantain,  English Negative    17. Lamb's Quarters Negative    18. Sheep Sorrell Negative    19. Rough Pigweed Negative    20. Marsh Elder, Rough Negative    21. Mugwort, Common Negative  22. Ash mix Negative    23. Birch mix Negative    24. Beech American Negative    25. Box, Elder Negative    26. Cedar, red Negative    27. Cottonwood, Guinea-Bissau Negative    28. Elm mix Negative    29. Hickory Negative    30. Maple mix Negative    31. Oak, [place] mix Negative    32. Pecan Pollen Negative    33. Pine mix Negative    34. Sycamore Eastern Negative    35. Walnut, Black Pollen Negative    36. Alternaria alternata Negative    37. Cladosporium Herbarum Negative    38. Aspergillus mix Negative    39. Penicillium mix Negative    40. Bipolaris sorokiniana (Helminthosporium) Negative    41. Drechslera spicifera (Curvularia) Negative    42.  Mucor plumbeus Negative    43. Fusarium moniliforme Negative    44. Aureobasidium pullulans (pullulara) Negative    45. Rhizopus oryzae Negative    46. Botrytis cinera Negative    47. Epicoccum nigrum Negative    48. Phoma betae Negative    49. Candida Albicans Negative    50. Trichophyton mentagrophytes Negative    51. Mite, D Farinae  5,000 AU/ml Negative    52. Mite, D Pteronyssinus  5,000 AU/ml Negative    53. Cat Hair 10,000 BAU/ml Negative    54.  Dog Epithelia Negative    55. Mixed Feathers Negative    56. Horse Epithelia Negative    57. Cockroach, German Negative    58. Mouse Negative    59. Tobacco Leaf Negative             Food Perc - 10/15/22 1525       Test Information   Time Antigen Placed 1525    Allergen Manufacturer Waynette Buttery    Location Back    Number of allergen test 10    Food Select      Food   1. Peanut Negative    2. Soybean food Negative    3. Wheat, whole Negative    4. Sesame Negative    5. Milk, cow Negative    6. Egg White, chicken Negative    7. Casein Negative    8. Shellfish mix Negative    9. Fish mix Negative    10. Cashew Negative            Reducing Pollen Exposure  The American Academy of Allergy, Asthma and Immunology suggests the following steps to reduce your exposure to pollen during allergy seasons.    Do not hang sheets or clothing out to dry; pollen may collect on these items. Do not mow lawns or spend time around freshly cut grass; mowing stirs up pollen. Keep windows closed at night.  Keep car windows closed while driving. Minimize morning activities outdoors, a time when pollen counts are usually at their highest. Stay indoors as much as possible when pollen counts or humidity is high and on windy days when pollen tends to remain in the air longer. Use air conditioning when possible.  Many air conditioners have filters that trap the pollen spores. Use a HEPA room air filter to remove pollen form the indoor air you  breathe.

## 2022-10-20 ENCOUNTER — Ambulatory Visit (HOSPITAL_BASED_OUTPATIENT_CLINIC_OR_DEPARTMENT_OTHER): Payer: BC Managed Care – PPO | Admitting: Family Medicine

## 2022-10-23 LAB — CBC WITH DIFFERENTIAL
Basophils Absolute: 0.1 10*3/uL (ref 0.0–0.2)
Basos: 1 %
EOS (ABSOLUTE): 0.4 10*3/uL (ref 0.0–0.4)
Eos: 6 %
Hematocrit: 44.4 % (ref 37.5–51.0)
Hemoglobin: 15.6 g/dL (ref 13.0–17.7)
Immature Grans (Abs): 0 10*3/uL (ref 0.0–0.1)
Immature Granulocytes: 0 %
Lymphocytes Absolute: 2.2 10*3/uL (ref 0.7–3.1)
Lymphs: 35 %
MCH: 29.3 pg (ref 26.6–33.0)
MCHC: 35.1 g/dL (ref 31.5–35.7)
MCV: 83 fL (ref 79–97)
Monocytes Absolute: 0.5 10*3/uL (ref 0.1–0.9)
Monocytes: 8 %
Neutrophils Absolute: 3.2 10*3/uL (ref 1.4–7.0)
Neutrophils: 50 %
RBC: 5.33 x10E6/uL (ref 4.14–5.80)
RDW: 12.2 % (ref 11.6–15.4)
WBC: 6.4 10*3/uL (ref 3.4–10.8)

## 2022-10-23 LAB — ALPHA-GAL PANEL
Allergen Lamb IgE: <0.1 kU/L
Beef IgE: <0.1 kU/L
IgE (Immunoglobulin E), Serum: 24 IU/mL (ref 6–495)
O215-IgE Alpha-Gal: <0.1 kU/L
Pork IgE: <0.1 kU/L

## 2022-10-23 LAB — CMP14+EGFR
ALT: 14 IU/L (ref 0–44)
AST: 17 IU/L (ref 0–40)
Albumin/Globulin Ratio: 2.8 — ABNORMAL HIGH (ref 1.2–2.2)
Albumin: 5.6 g/dL — ABNORMAL HIGH (ref 4.3–5.2)
Alkaline Phosphatase: 86 IU/L (ref 44–121)
BUN/Creatinine Ratio: 9 (ref 9–20)
BUN: 9 mg/dL (ref 6–20)
Bilirubin Total: 0.5 mg/dL (ref 0.0–1.2)
CO2: 23 mmol/L (ref 20–29)
Calcium: 10.4 mg/dL — ABNORMAL HIGH (ref 8.7–10.2)
Chloride: 104 mmol/L (ref 96–106)
Creatinine, Ser: 0.98 mg/dL (ref 0.76–1.27)
Globulin, Total: 2 g/dL (ref 1.5–4.5)
Glucose: 84 mg/dL (ref 70–99)
Potassium: 4.1 mmol/L (ref 3.5–5.2)
Sodium: 144 mmol/L (ref 134–144)
Total Protein: 7.6 g/dL (ref 6.0–8.5)
eGFR: 111 mL/min/{1.73_m2} (ref >59)

## 2022-10-23 LAB — C-REACTIVE PROTEIN: CRP: <1 mg/L (ref 0–10)

## 2022-10-23 LAB — THYROID ANTIBODIES
Thyroglobulin Antibody: <1 IU/mL (ref 0.0–0.9)
Thyroperoxidase Ab SerPl-aCnc: 13 IU/mL (ref 0–34)

## 2022-10-23 LAB — CHRONIC URTICARIA: cu index: 6.6 (ref <10)

## 2022-10-23 LAB — TRYPTASE: Tryptase: 6.2 ug/L (ref 2.2–13.2)

## 2022-10-23 LAB — ANTINUCLEAR ANTIBODIES, IFA: ANA Titer 1: NEGATIVE

## 2022-10-23 LAB — SEDIMENTATION RATE: Sed Rate: 2 mm/hr (ref 0–15)

## 2022-12-11 DIAGNOSIS — Z202 Contact with and (suspected) exposure to infections with a predominantly sexual mode of transmission: Secondary | ICD-10-CM | POA: Diagnosis not present

## 2022-12-22 DIAGNOSIS — B349 Viral infection, unspecified: Secondary | ICD-10-CM | POA: Diagnosis not present

## 2022-12-22 DIAGNOSIS — J398 Other specified diseases of upper respiratory tract: Secondary | ICD-10-CM | POA: Diagnosis not present

## 2022-12-22 DIAGNOSIS — R5383 Other fatigue: Secondary | ICD-10-CM | POA: Diagnosis not present

## 2022-12-22 DIAGNOSIS — J029 Acute pharyngitis, unspecified: Secondary | ICD-10-CM | POA: Diagnosis not present

## 2022-12-26 ENCOUNTER — Telehealth: Payer: BC Managed Care – PPO | Admitting: Nurse Practitioner

## 2022-12-26 DIAGNOSIS — J0100 Acute maxillary sinusitis, unspecified: Secondary | ICD-10-CM

## 2022-12-26 MED ORDER — AMOXICILLIN-POT CLAVULANATE 875-125 MG PO TABS: 1.0000 | ORAL_TABLET | Freq: Two times a day (BID) | ORAL | 0 refills | Status: DC

## 2022-12-26 NOTE — Progress Notes (Signed)

## 2023-01-22 ENCOUNTER — Encounter (HOSPITAL_BASED_OUTPATIENT_CLINIC_OR_DEPARTMENT_OTHER): Payer: Self-pay | Admitting: Family Medicine

## 2023-02-02 DIAGNOSIS — Q141 Congenital malformation of retina: Secondary | ICD-10-CM | POA: Diagnosis not present

## 2023-02-16 ENCOUNTER — Encounter (HOSPITAL_BASED_OUTPATIENT_CLINIC_OR_DEPARTMENT_OTHER): Payer: Self-pay | Admitting: Family Medicine

## 2023-02-16 ENCOUNTER — Ambulatory Visit (INDEPENDENT_AMBULATORY_CARE_PROVIDER_SITE_OTHER): Payer: BC Managed Care – PPO | Admitting: Family Medicine

## 2023-02-16 VITALS — BP 143/85 | HR 72 | Ht 75.0 in | Wt 177.1 lb

## 2023-02-16 DIAGNOSIS — N50812 Left testicular pain: Secondary | ICD-10-CM

## 2023-02-16 NOTE — Progress Notes (Signed)
Acute Office Visit  Subjective:     Patient ID: Melvin Kelly, male    DOB: June 10, 2001, 22 y.o.   MRN: [id number]  Chief Complaint  Patient presents with   Testicle Pain    Ongoing for about 3 years, concerning   Melvin Kelly is a 22 yo male patient who presents today for testicular pain.   He had a scrotal ultrasound in 2021 with no mass or microlithiasis noted in either testicle. [place] noted a 34mm left scrotal pearl. He was referred to urology and seen 04/23/2020. Urology discussed strategies for scrotal pain but he does not feel they addressed why the pain was occurring. He reports he was given a medication (Celebrex 100mg  daily for 2 weeks) with some pain relief while he was taking it but pain returned after. Reports intermittent, dull pain that comes and goes. Jumping up and down increases his pain. Denies any growths, lumps, penile discharge, urinary frequency/urgency, dysuria, or recent STI exposure. Denies testicular and scrotal surgery in the past. He went to an urgent care 3-4 weeks ago and had "every test done" for STIs. He reports that all tests were negative and has not been sexually active since testing. He is concerned about the pain still being there.   Review of Systems  Constitutional:  Negative for malaise/fatigue.  Respiratory:  Negative for cough and shortness of breath.   Cardiovascular:  Negative for chest pain and palpitations.  Gastrointestinal:  Negative for abdominal pain, nausea and vomiting.  Genitourinary:  Negative for dysuria, flank pain, frequency, hematuria and urgency.  Musculoskeletal:  Negative for myalgias.  Neurological:  Negative for dizziness, weakness and headaches.  Psychiatric/Behavioral:  Negative for depression and suicidal ideas. The patient is not nervous/anxious.     Objective:    BP (!) 143/85   Pulse 72   Ht 6\' 3"  (1.905 m)   Wt 177 lb 1.6 oz (80.3 kg)   SpO2 98%   BMI 22.14 kg/m   Physical Exam Constitutional:      Appearance:  Normal appearance.  Genitourinary:    Penis: Normal.      Testes: Normal. Cremasteric reflex is present.        Right: Mass, tenderness, swelling, testicular hydrocele or varicocele not present.        Left: Mass, tenderness, swelling, testicular hydrocele or varicocele not present.     Epididymis:     Right: Normal.     Left: Normal.  Neurological:     Mental Status: He is alert.  Psychiatric:        Mood and Affect: Mood normal.        Behavior: Behavior normal.        Thought Content: Thought content normal.        Judgment: Judgment normal.      Assessment & Plan:  1. Pain in left testicle Patient presents today with chronic left testicular pain. He reports seeing urology after having a scrotal ultrasound in 2021 but is concerned because he continues to experience pain. No UTI symptoms of dysuria, frequency, urgency, nocturia, or cloudy urine. No signs of acute infection-- no fever, chills, suprapubic and/or perineal pain, and no pain radiating to back or rectum. Physical exam unremarkable. No erythema, tenderness, swelling, lesions, or discharge present on the penis. No tenderness, warmth, or erythema present during palpation of epididymis. No hard nodule palpated in scrotum. No testicular/scrotal tenderness or erythema present. Discussed options, will order ultrasound of scrotum and refer patient to urology for  evaluation of chronic left testicular pain.  - Ambulatory referral to Urology - US Scrotum; Future   Return if symptoms worsen or fail to improve.  Les Pou, FNP

## 2023-02-18 DIAGNOSIS — R309 Painful micturition, unspecified: Secondary | ICD-10-CM | POA: Diagnosis not present

## 2023-02-18 DIAGNOSIS — N341 Nonspecific urethritis: Secondary | ICD-10-CM | POA: Diagnosis not present

## 2023-02-18 DIAGNOSIS — Z113 Encounter for screening for infections with a predominantly sexual mode of transmission: Secondary | ICD-10-CM | POA: Diagnosis not present

## 2023-02-22 ENCOUNTER — Telehealth (HOSPITAL_BASED_OUTPATIENT_CLINIC_OR_DEPARTMENT_OTHER): Payer: Self-pay | Admitting: Family Medicine

## 2023-02-22 NOTE — Telephone Encounter (Signed)
Please call Brandi with [facility]--and advise if the [place] needs to be with or without doppler

## 2023-03-09 ENCOUNTER — Ambulatory Visit
Admission: RE | Admit: 2023-03-09 | Discharge: 2023-03-09 | Disposition: A | Discharge status: Home / Self Care | Payer: BC Managed Care – PPO | Source: Ambulatory Visit | Attending: Family Medicine | Admitting: Family Medicine

## 2023-03-09 DIAGNOSIS — N50812 Left testicular pain: Secondary | ICD-10-CM

## 2023-03-09 DIAGNOSIS — G8929 Other chronic pain: Secondary | ICD-10-CM | POA: Diagnosis not present

## 2023-03-09 DIAGNOSIS — N503 Cyst of epididymis: Secondary | ICD-10-CM | POA: Diagnosis not present

## 2023-03-09 DIAGNOSIS — N433 Hydrocele, unspecified: Secondary | ICD-10-CM | POA: Diagnosis not present

## 2023-03-10 ENCOUNTER — Encounter (HOSPITAL_BASED_OUTPATIENT_CLINIC_OR_DEPARTMENT_OTHER): Payer: Self-pay | Admitting: Family Medicine

## 2023-03-11 ENCOUNTER — Encounter: Payer: Self-pay | Admitting: Urology

## 2023-03-11 ENCOUNTER — Ambulatory Visit (INDEPENDENT_AMBULATORY_CARE_PROVIDER_SITE_OTHER): Payer: BC Managed Care – PPO | Admitting: Urology

## 2023-03-11 VITALS — BP 121/78 | HR 76 | Ht 75.0 in | Wt 174.0 lb

## 2023-03-11 DIAGNOSIS — N50812 Left testicular pain: Secondary | ICD-10-CM | POA: Diagnosis not present

## 2023-03-11 DIAGNOSIS — N50819 Testicular pain, unspecified: Secondary | ICD-10-CM

## 2023-03-11 LAB — URINALYSIS, COMPLETE
Bilirubin, UA: NEGATIVE
Glucose, UA: NEGATIVE
Ketones, UA: NEGATIVE
Leukocytes,UA: NEGATIVE
Nitrite, UA: NEGATIVE
Protein,UA: NEGATIVE
RBC, UA: NEGATIVE
Specific Gravity, UA: 1.02 (ref 1.005–1.030)
Urobilinogen, Ur: 0.2 mg/dL (ref 0.2–1.0)
pH, UA: 6.5 (ref 5.0–7.5)

## 2023-03-11 LAB — MICROSCOPIC EXAMINATION

## 2023-03-11 MED ORDER — CELECOXIB 200 MG PO CAPS: 200.0000 mg | ORAL_CAPSULE | Freq: Two times a day (BID) | ORAL | 0 refills | Status: AC

## 2023-03-11 NOTE — Patient Instructions (Signed)
Pelvic Floor Dysfunction, Male     Pelvic floor dysfunction (PFD) is a condition that results when the group of muscles and connective tissues that support the organs in the pelvis (pelvic floor muscles) do not work well. These muscles and their connections form a sling that supports the colon and bladder. In men, these muscles also support the prostate gland. PFD causes pelvic floor muscles to be too weak, too tight, or both. In PFD, muscle movements are not coordinated. This may cause bowel or bladder problems. It may also cause pain. What are the causes? This condition may be caused by an injury to the pelvic area or by a weakening of pelvic muscles. In many cases, the exact cause is not known. What increases the risk? The following factors may make you more likely to develop PFD: Having chronic bladder tissue inflammation (interstitial cystitis). Being an older person. Being overweight. History of radiation treatment for cancer in the pelvic region. Previous pelvic surgery, such as removal of the prostate gland (prostatectomy). What are the signs or symptoms? Symptoms of this condition vary and may include: Bladder symptoms, such as: Trouble starting urination and emptying the bladder. Frequent urinary tract infections. Leaking urine when coughing, laughing, or exercising (stress incontinence). Having to pass urine urgently or frequently. Pain when passing urine. Bowel symptoms, such as: Constipation. Urgent or frequent bowel movements. Incomplete bowel movements. Painful bowel movements. Leaking stool or gas. Unexplained genital or rectal pain. Genital or rectal muscle spasms. Low back pain. Sexual dysfunction, such as erectile dysfunction, premature ejaculation, or pain during or after sexual activity. How is this diagnosed? This condition is diagnosed based on: Your symptoms and medical history. A physical exam. During the exam, your health care provider may check your  pelvic muscles for tightness, spasm, pain, or weakness. This may include a rectal exam. In some cases, you may have diagnostic tests, such as: Electrical muscle function tests. Urine flow testing. X-ray tests of bowel function. Ultrasound of the pelvic organs. How is this treated? Treatment for this condition depends on your symptoms. Treatment options include: Physical therapy. This may include Kegel exercises to help relax or strengthen the pelvic floor muscles. Biofeedback. This type of therapy provides feedback on how tight your pelvic floor muscles are so that you can learn to control them. Massage therapy. A treatment that involves electrical stimulation of the pelvic floor muscles to help control pain (transcutaneous electrical nerve stimulation, or TENS). Sound wave therapy (ultrasound) to reduce muscle spasms. Medicines, such as: Muscle relaxants. Bladder control medicines. Surgery to reconstruct or support pelvic floor muscles may be an option if other treatments do not help. Follow these instructions at home: Activity Do your usual activities as told by your health care provider. Ask your health care provider if you should modify any activities. Do pelvic floor strengthening or relaxing exercises at home as told by your physical therapist. Lifestyle Maintain a healthy weight. Eat foods that are high in fiber, such as beans, whole grains, and fresh fruits and vegetables. Limit foods that are high in fat and processed sugars, such as fried or sweet foods. Manage stress with relaxation techniques such as yoga or meditation. General instructions If you have problems with leakage: Use absorbable pads or wear padded underwear. Wash your genital and anal area frequently with mild soap. Keep your genital and anal area as clean and dry as possible. Ask your health care provider if you should try a barrier cream to prevent skin irritation. Take warm baths   to relieve pelvic muscle  tension or spasms. Take over-the-counter and prescription medicines only as told by your health care provider. Keep all follow-up visits. How is this prevented? The cause of PFD is not always known, but there are a few things you can do to reduce the risk of developing this condition, including: Staying at a healthy weight. Getting regular exercise. Managing stress. Contact a health care provider if: Your symptoms are not improving with home care. You have signs or symptoms of PFD that get worse. You develop new signs or symptoms. You have signs of a urinary tract infection, such as: Fever. Chills. Increased urinary frequency. A burning feeling when urinating. You have not had a bowel movement in 3 days (constipation). Summary Pelvic floor dysfunction results when the muscles and connective tissues in your pelvic floor do not work well. These muscles and their connections form a sling that supports your colon and bladder. In men, these muscles also support the prostate gland. PFD may be caused by an injury to the pelvic area or by a weakening of pelvic muscles. PFD causes pelvic floor muscles to be too weak, too tight, or a combination of both. Symptoms may vary from person to person. In most cases, PFD can be treated with physical therapies and medicines. Surgery may be an option if other treatments do not help. This information is not intended to replace advice given to you by your health care provider. Make sure you discuss any questions you have with your health care provider. Document Revised: 03/12/2021 Document Reviewed: 03/12/2021 Elsevier Patient Education  2023 Elsevier Inc.    

## 2023-03-11 NOTE — Progress Notes (Signed)
   03/11/2023 10:32 AM   Melvin Kelly 12-26-2000 [id number]  Reason for visit: Follow up left scrotal pain  HPI: 22 year old male who I originally saw in June 2021 for similar issues with left-sided scrotal pain.  Urinalysis benign today, he has had multiple negative scrotal ultrasounds, including most recently 03/09/2023 showing no abnormalities.  I personally viewed and interpreted the most recent scrotal ultrasound with no evidence of masses or other etiology of his pain.  His pain is worse with physical activity and jumping.  The pain is primarily behind the left testicle.  He also reports some vague lower abdominal and low back pain.  At our previous visit I recommended a short course of Celebrex and considering pelvic floor PT, but he never followed up.  On exam, testicles 20 cc and descended bilaterally, nontender, no masses.  Mild tenderness more posterior to the left testicle in the left perineum and groin.  Symptoms are more consistent with pelvic floor dysfunction/idiopathic scrotal pain.  I recommended another course of the Celebrex x 2 weeks, pelvic floor stretching exercises provided, pelvic floor PT referral placed.  Could consider trial of amitriptyline or gabapentin in the future, or referral to Dr. Lafonda Mosses in [place] for consideration of denervation.  Sondra Come, MD  [facility] [address], [address] [place], [place] [address] [phone]

## 2023-04-19 DIAGNOSIS — L650 Telogen effluvium: Secondary | ICD-10-CM | POA: Diagnosis not present

## 2023-04-19 DIAGNOSIS — Z79899 Other long term (current) drug therapy: Secondary | ICD-10-CM | POA: Diagnosis not present

## 2023-07-14 DIAGNOSIS — G43909 Migraine, unspecified, not intractable, without status migrainosus: Secondary | ICD-10-CM | POA: Diagnosis not present

## 2023-07-28 ENCOUNTER — Encounter: Payer: Self-pay | Admitting: Physician Assistant

## 2023-07-28 ENCOUNTER — Ambulatory Visit (INDEPENDENT_AMBULATORY_CARE_PROVIDER_SITE_OTHER): Payer: BC Managed Care – PPO | Admitting: Physician Assistant

## 2023-07-28 VITALS — BP 96/59 | HR 64 | Wt 180.6 lb

## 2023-07-28 DIAGNOSIS — R3129 Other microscopic hematuria: Secondary | ICD-10-CM | POA: Diagnosis not present

## 2023-07-28 LAB — MICROSCOPIC EXAMINATION

## 2023-07-28 LAB — URINALYSIS, COMPLETE
Bilirubin, UA: NEGATIVE
Glucose, UA: NEGATIVE
Ketones, UA: NEGATIVE
Leukocytes,UA: NEGATIVE
Nitrite, UA: NEGATIVE
Protein,UA: NEGATIVE
Specific Gravity, UA: 1.02 (ref 1.005–1.030)
Urobilinogen, Ur: 0.2 mg/dL (ref 0.2–1.0)
pH, UA: 6.5 (ref 5.0–7.5)

## 2023-07-28 NOTE — Progress Notes (Unsigned)
07/28/2023 2:50 PM   [name] 2001-07-13 [id number]  CC: Chief Complaint  Patient presents with   urinary issues   HPI: Melvin Kelly is a 22 y.o. male with PMH left scrotal pain who presents today for urgent care follow-up and UA recheck.   He reports new onset urinary frequency and an odd sensation at the tip of his urethra last weekend.  He sought care at [facility] clinic walk-in 10 days ago for this.  UA was notable for 4-10 RBCs/hpf.  They were concerned for possible STI and treated him with Rocephin and doxycycline.  Gonorrhea and Chlamydia testing were negative and urine culture grew mixed urogenital flora.  He states that his frequency and urethral sensation resolved after his office visit, however he has noticed bilateral, L>R flank pain over the past 2 days that has been intermittent.  He has not seen a stone pass.  He does not have a personal history of nephrolithiasis, but he does have a family history of nephrolithiasis in his father.  In-office UA today positive for trace intact blood; urine microscopy pan negative.  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/2019   Procedure: ENDOSCOPIC CONCHA BULLOSA RESECTION;  Surgeon: [name], MD;  Location: [facility];  Service: ENT;  Laterality: Bilateral;   NASAL SEPTOPLASTY W/ TURBINOPLASTY Bilateral 06/15/2019   Procedure: NASAL SEPTOPLASTY WITH INFERIOR TURBINATE REDUCTION;  Surgeon: [name], MD;  Location: [facility];  Service: ENT;  Laterality: Bilateral;   SINOSCOPY     TONSILLECTOMY Bilateral 03/27/2021   Procedure: TONSILLECTOMY POSSIBLE ADENOID;  Surgeon: [name], MD;  Location: [facility];   Service: ENT;  Laterality: Bilateral;  no adenoid tissue found   TONSILLECTOMY     WISDOM TOOTH EXTRACTION     age 61    Home Medications:  Allergies as of 07/28/2023   No Known Allergies      Medication List        Accurate as of July 28, 2023  2:50 PM. If you have any questions, ask your nurse or doctor.          celecoxib 200 MG capsule Commonly known as: CeleBREX Take 1 capsule (200 mg total) by mouth 2 (two) times daily.   Emgality 120 MG/ML Soaj Generic drug: Galcanezumab-gnlm SMARTSIG:120 Milligram(s) SUB-Q Once a Month        Allergies:  No Known Allergies  Family History: Family History  Problem Relation Age of Onset   Allergic rhinitis Mother    Healthy Mother    Allergic rhinitis Father    Healthy Father    Allergic rhinitis Sister    Eczema Sister    Allergic rhinitis Brother     Social History:   reports that he has never smoked. He has never been exposed to tobacco smoke. He has never used smokeless tobacco. He reports current alcohol use. He reports that he does not use drugs.  Physical Exam: BP (!) 96/59   Pulse 64   Wt 180 lb 9.6 oz (81.9 kg)   BMI 22.57 kg/m   Constitutional:  Alert and oriented, no acute distress, nontoxic appearing HEENT: [place], AT Cardiovascular: No clubbing, cyanosis, or  edema Respiratory: Normal respiratory effort, no increased work of breathing Skin: No rashes, bruises or suspicious lesions Neurologic: Grossly intact, no focal deficits, moving all 4 extremities Psychiatric: Normal mood and affect  Laboratory Data: Results for orders placed or performed in visit on 07/28/23  Microscopic Examination   Urine  Result Value Ref Range   WBC, UA 0-5 0 - 5 /hpf   RBC, Urine 0-2 0 - 2 /hpf   Epithelial Cells (non renal) 0-10 0 - 10 /hpf   Mucus, UA Present (A) Not Estab.   Bacteria, UA Few None seen/Few  Urinalysis, Complete  Result Value Ref Range   Specific Gravity, UA 1.020 1.005 - 1.030   pH, UA 6.5  5.0 - 7.5   Color, UA Yellow Yellow   Appearance Ur Clear Clear   Leukocytes,UA Negative Negative   Protein,UA Negative Negative/Trace   Glucose, UA Negative Negative   Ketones, UA Negative Negative   RBC, UA Trace (A) Negative   Bilirubin, UA Negative Negative   Urobilinogen, Ur 0.2 0.2 - 1.0 mg/dL   Nitrite, UA Negative Negative   Microscopic Examination See below:    Assessment & Plan:   1. Microscopic hematuria Urinary frequency, odd sensation at the tip of the urethra, and now bilateral flank pain with microscopic hematuria and negative urine and STI testing.  With his family history of nephrolithiasis, I wonder if he is passing a stone.  I offered him a CT stone study and he agreed. - Urinalysis, Complete - CT RENAL STONE STUDY; Future  Return for Will call with results.  Carman Ching, PA-C  [facility] [address] [place], [place] [address] [phone]

## 2023-08-11 ENCOUNTER — Ambulatory Visit
Admission: RE | Admit: 2023-08-11 | Discharge: 2023-08-11 | Disposition: A | Discharge status: Home / Self Care | Payer: BC Managed Care – PPO | Source: Ambulatory Visit | Attending: Physician Assistant | Admitting: Physician Assistant

## 2023-08-11 DIAGNOSIS — R3129 Other microscopic hematuria: Secondary | ICD-10-CM | POA: Insufficient documentation

## 2023-10-07 ENCOUNTER — Encounter (HOSPITAL_BASED_OUTPATIENT_CLINIC_OR_DEPARTMENT_OTHER): Payer: Self-pay | Admitting: Family Medicine

## 2023-12-15 ENCOUNTER — Telehealth (HOSPITAL_BASED_OUTPATIENT_CLINIC_OR_DEPARTMENT_OTHER): Payer: Self-pay | Admitting: *Deleted

## 2023-12-15 NOTE — Telephone Encounter (Signed)
Called patient to schedule an appointment. Pt did not want to schedule no longer seeing dr de [place] as PCP  Advised would remove the pcp with verbal understanding

## 2024-02-07 ENCOUNTER — Telehealth: Admitting: Physician Assistant

## 2024-02-07 DIAGNOSIS — W540XXA Bitten by dog, initial encounter: Secondary | ICD-10-CM | POA: Diagnosis not present

## 2024-02-07 DIAGNOSIS — L0390 Cellulitis, unspecified: Secondary | ICD-10-CM

## 2024-02-07 MED ORDER — AMOXICILLIN-POT CLAVULANATE 875-125 MG PO TABS
1.0000 | ORAL_TABLET | Freq: Two times a day (BID) | ORAL | 0 refills | Status: AC
Start: 2024-02-07 — End: ?

## 2024-02-07 NOTE — Progress Notes (Signed)
 E-Visit for Simple Cut/Laceration  We are sorry that you have had an injury. Here is how we plan to help!  Based on what you shared with me it looks like you have a simple laceration that does not need to be repaired with stitches or tissue glue.  and I have prescribed Augmentin 875-125 mg by mouth twice daily for seven days  HOME CARE: Clean the cut or scrape - Wash it well with soap and water. * avoid using hydrogen peroxide which may cause tissue damage, or impede wound healing.  Stop the bleeding - If your cut or scrape is bleeding, press a clean cloth or bandage firmly on the area for 20 minutes. You can also help slow the bleeding by holding the cut above the level of your heart.   Put a thin layer of Bacitracin antibiotic ointment on the cut or scrape. (this can be purchased at any local pharmacy- ask your pharmacist if you need assistance)   Cover the cut or scrape with a bandage or gauze. Keep the bandage clean and dry. Change the bandage 1 to 2 times every day until your cut or scrape heals.   Watch for signs that your cut or scrape is infected (redness, drainage, pain, warmth, swelling or fever)  Over the next 48 hours your wound should start to improve with less pain, less swelling and less redness. If you should develop increasing pain, swelling, redness, fever, pus from the wound you should be seen immediately to make sure this is not becoming infected.   WOUND CARE: Please keep a layer of antibiotic ointment (bacitracin preferred) on this wound at least twice a day for the next seven days and keep a sterile dressing over top of it. You may gently clean the wound with warm soap and water between dressing changes.  We strongly recommend that you have a medical provider reevaluate your wound within 2 to 3 days in person to make sure that it is healing appropriately.  Thank you for choosing an e-visit.  Your e-visit answers were reviewed by a board certified advanced clinical  practitioner to complete your personal care plan. Depending upon the condition, your plan could have included both over the counter or prescription medications.  Please review your pharmacy choice. Make sure the pharmacy is open so you can pick up prescription now. If there is a problem, you may contact your provider through [employer] and have the prescription routed to another pharmacy.  Your safety is important to [place]. If you have drug allergies check your prescription carefully.   For the next 24 hours you can use MyChart to ask questions about today's visit, request a non-urgent call back, or ask for a work or school excuse. You will get an email in the next two days asking about your experience. I hope that your e-visit has been valuable and will speed your recovery.   I have spent 5 minutes in review of e-visit questionnaire, review and updating patient chart, medical decision making and response to patient.   Margaretann Loveless, PA-C

## 2024-04-05 ENCOUNTER — Inpatient Hospital Stay

## 2024-04-05 ENCOUNTER — Encounter: Payer: Self-pay | Admitting: Licensed Clinical Social Worker

## 2024-04-05 ENCOUNTER — Other Ambulatory Visit: Payer: Self-pay | Admitting: Licensed Clinical Social Worker

## 2024-04-05 ENCOUNTER — Inpatient Hospital Stay: Attending: Oncology | Admitting: Licensed Clinical Social Worker

## 2024-04-05 DIAGNOSIS — Z8042 Family history of malignant neoplasm of prostate: Secondary | ICD-10-CM

## 2024-04-05 DIAGNOSIS — Z8481 Family history of carrier of genetic disease: Secondary | ICD-10-CM

## 2024-04-05 DIAGNOSIS — Z803 Family history of malignant neoplasm of breast: Secondary | ICD-10-CM

## 2024-04-05 DIAGNOSIS — Z1379 Encounter for other screening for genetic and chromosomal anomalies: Secondary | ICD-10-CM

## 2024-04-05 DIAGNOSIS — Z800 Family history of malignant neoplasm of digestive organs: Secondary | ICD-10-CM

## 2024-04-05 LAB — GENETIC SCREENING ORDER

## 2024-04-05 NOTE — Progress Notes (Signed)
 REFERRING PROVIDER: Self-referred  PRIMARY PROVIDER:  No primary care provider on file.  PRIMARY REASON FOR VISIT:  1. Family history of BRCA2 gene positive   2. Family history of breast cancer   3. Family history of prostate cancer   4. Family history of colon cancer      HISTORY OF PRESENT ILLNESS:   [name], a 23 y.o. male, was seen for a [facility] cancer genetics consultation due to his mother's recent genetic testing that showed a BRCA2 mutation.  [name] presents to clinic today to discuss the possibility of a hereditary predisposition to cancer, genetic testing, and to further clarify his future cancer risks, as well as potential cancer risks for family members.    CANCER HISTORY:  [name] is a 23 y.o. male with no personal history of cancer.    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   Procedure: ENDOSCOPIC CONCHA BULLOSA RESECTION;  Surgeon: Mellody Sprout, MD;  Location: [facility];  Service: ENT;  Laterality: Bilateral;   NASAL SEPTOPLASTY W/ TURBINOPLASTY Bilateral 06/15/2019   Procedure: NASAL SEPTOPLASTY WITH INFERIOR TURBINATE REDUCTION;  Surgeon: Mellody Sprout, MD;  Location: [facility];  Service: ENT;  Laterality: Bilateral;   SINOSCOPY     TONSILLECTOMY Bilateral 03/27/2021   Procedure: TONSILLECTOMY POSSIBLE ADENOID;  Surgeon: Mellody Sprout, MD;  Location: [facility];  Service: ENT;  Laterality: Bilateral;  no adenoid tissue found   TONSILLECTOMY     WISDOM TOOTH EXTRACTION     age 42    FAMILY HISTORY:  We obtained a detailed, 4-generation family history.  Significant diagnoses are listed below: Family History  Problem Relation Age of Onset   Allergic rhinitis Mother     Breast cancer Mother 23       BRCA2+   Allergic rhinitis Father    Healthy Father    Allergic rhinitis Sister    Eczema Sister    Allergic rhinitis Brother    Prostate cancer Maternal Grandfather        dx 60s   Colon cancer Paternal Grandmother        dx 53s   Colon cancer Paternal Grandfather        dx early 27s   [name] mother was recently diagnosed with breast cancer at 60 and underwent genetic testing that showed a BRCA2 pathogenic variant called c.1813dupA ([id number]). Patient's maternal grandfather had prostate cancer and passed at 61.   [name] father is living at 36. Patient's paternal grandmother had colon cancer in her 76s and paternal grandfather had colon cancer in his early 20s and passed from it.   Mr. Sunday is aware of previous family history of genetic testing for hereditary cancer risks. There is no reported Ashkenazi Jewish ancestry. There is no known consanguinity.    GENETIC COUNSELING ASSESSMENT: [name] is a 23 y.o. male with a family history of a BRCA2 mutation in his mother and paternal family history of early onset colon cancer.  We, therefore, discussed and recommended the following at today's visit.   DISCUSSION: We discussed that approximately 10% of cancer is hereditary. We discussed the BRCA2 gene in particular, noting cancer risks associated such as male  breast, prostate and pancreatic cancer and potential management changes. He has a 50% chance to have inherited this from his mother. There are other genes associated with hereditary cancer as well. Cancers and risks are gene specific.  Given his paternal family history of early onset colon cancer, he could pursue a panel test. We discussed that testing is beneficial for several reasons including knowing about cancer risks, identifying potential screening and risk-reduction options that may be appropriate, and to understand if other family members could be at risk for cancer and allow them to  undergo genetic testing.   We reviewed the characteristics, features and inheritance patterns of hereditary cancer syndromes. We also discussed genetic testing, including the appropriate family members to test, the process of testing, insurance coverage and turn-around-time for results. We discussed the implications of a negative, positive and/or variant of uncertain significant result. We recommended [name] pursue genetic testing for the [facility] CancerNext+RNA gene panel.   Based on [name] family history of cancer, he meets medical criteria for genetic testing. Despite that he meets criteria, he may still have an out of pocket cost.   We discussed that some people do not want to undergo genetic testing due to fear of genetic discrimination.  A federal law called the Genetic Information Non-Discrimination Act (GINA) of 2008 helps protect individuals against genetic discrimination based on their genetic test results.  It impacts both health insurance and employment.  For health insurance, it protects against increased premiums, being kicked off insurance or being forced to take a test in order to be insured.  For employment it protects against hiring, firing and promoting decisions based on genetic test results.  Health status due to a cancer diagnosis is not protected under GINA.  This law does not protect life insurance, disability insurance, or other types of insurance.   PLAN: After considering the risks, benefits, and limitations, [name] provided informed consent to pursue genetic testing and the blood sample was sent to [facility] for analysis of the CancerNext+RNA panel. Results should be available within approximately 2-3 weeks' time, at which point they will be disclosed by telephone to [name], as will any additional recommendations warranted by these results. [name] will receive a summary of his genetic counseling visit and a copy of his results once available. This information  will also be available in Epic.   [name] questions were answered to his satisfaction today. Our contact information was provided should additional questions or concerns arise. Thank you for the referral and allowing us  to share in the care of your patient.   Valri Gee, MS, [facility] Genetic Counselor [email or web address] Phone: [phone]  40 minutes were spent on the date of the encounter in service to the patient including preparation, face-to-face consultation, documentation and care coordination. Dr. Nelson Bandy was available for discussion regarding this case.   _______________________________________________________________________ For Office Staff:  Number of people involved in session: 1 Was an Intern/ student involved with case: no

## 2024-04-26 ENCOUNTER — Telehealth: Payer: Self-pay | Admitting: Licensed Clinical Social Worker

## 2024-04-26 ENCOUNTER — Ambulatory Visit: Payer: Self-pay | Admitting: Licensed Clinical Social Worker

## 2024-04-26 ENCOUNTER — Encounter: Payer: Self-pay | Admitting: Licensed Clinical Social Worker

## 2024-04-26 DIAGNOSIS — Z1501 Genetic susceptibility to malignant neoplasm of breast: Secondary | ICD-10-CM | POA: Insufficient documentation

## 2024-04-26 DIAGNOSIS — Z1379 Encounter for other screening for genetic and chromosomal anomalies: Secondary | ICD-10-CM

## 2024-04-26 NOTE — Progress Notes (Signed)
 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 Please refer to our prior cancer genetics clinic note for more information regarding our discussion, assessment and recommendations, at the time. Melvin Kelly recent genetic test results were disclosed to him, as were recommendations warranted by these results. These results and recommendations are discussed in more detail below.  CANCER HISTORY:  Oncology History   No history exists.    FAMILY HISTORY:  We obtained a detailed, 4-generation family history.  Significant diagnoses are listed below: Family History  Problem Relation Age of Onset   Allergic rhinitis Mother    Breast cancer Mother 72       BRCA2+   Allergic rhinitis Father    Healthy Father    Allergic rhinitis Sister    Eczema Sister    Allergic rhinitis Brother    Prostate cancer Maternal Grandfather        dx 32s   Colon cancer Paternal Grandmother        dx 55s   Colon cancer Paternal Grandfather        dx early 9s    Melvin Kelly mother was recently diagnosed with breast cancer at 25 and underwent genetic testing that showed a BRCA2 pathogenic variant called c.1813dupA ([id number]). Patient's maternal grandfather had prostate cancer and passed at 31.    Melvin Kelly father is living at 41. Patient's paternal grandmother had colon cancer in her 56s and paternal grandfather had colon cancer in his early 23s and passed from it.    Melvin Kelly is aware of previous family history of genetic testing for hereditary cancer risks. There is no reported Ashkenazi Jewish ancestry. There is no known consanguinity.     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 The remainder of the [facility] CancerNext+RNA panel was negative/normal.  The [facility] CancerNext+RNAinsight Panel includes sequencing, rearrangement analysis, and RNA analysis for the following 40 genes: APC, ATM, BAP1, BARD1, BMPR1A, BRCA1, BRCA2, BRIP1, CDH1, CDKN2A, CHEK2, FH, FLCN, MET, MLH1, MSH2, MSH6, MUTYH, NF1, NTHL1, PALB2, PMS2, PTEN, RAD51C, RAD51D, RPS20, SMAD4, STK11, TP53, TSC1, TSC2, and VHL (sequencing and deletion/duplication); AXIN2, HOXB13, MBD4, MSH3, POLD1 and POLE (sequencing only); EPCAM and GREM1 (deletion/duplication only).  The test report has been scanned into EPIC and is located under the Molecular Pathology section of the Results Review tab.  A portion of the result report is included below for reference. Genetic testing reported out on 04/25/2024.     Clinical Information: Hereditary breast and ovarian cancer (HBOC) syndrome is characterized by an increased lifetime risk for, generally, adult-onset cancers including, breast, contralateral breast, male breast, ovarian, prostate, melanoma and pancreatic.  The cancers associated with BRCA2 are: Male breast cancer, up to an 84% risk In women with a history of breast cancer, the risk for contralateral breast cancer 10 years after breast cancer diagnosis is 10-30%.  Male breast cancer, up to an 8% risk Ovarian cancer, 13-29% risk Pancreatic cancer, 5-10% risk Prostate cancer, 19-61% risk Melanoma, elevated risk   Management Recommendations (NCCN v3.2025):  Breast Screening/Risk Reduction:  Females: Breast awareness starting at age 50 Clinical breast examination every 6-12 months starting at age 52  Breast cancer screening: Age 68-29 years, annual breast  MRI with and without contrast (or mammogram, if MRI is unavailable), although the age to initiate screening may be individualized based on family history Age 51-75 years, annual mammogram and breast MRI with and without contrast Age >75  years, management should be considered on an individual basis For women with a BRCA2 pathogenic or likely pathogenic variant who are treated for breast cancer and have not had a bilateral mastectomy, screening with annual mammogram and breast MRI should continue as described above. The option of prophylactic bilateral risk-reducing mastectomy (RRM), removal of the breast tissue before cancer develops, is the best option for significantly decreasing the risk of developing breast cancer. Studies have shown mastectomies reduce the risk of breast cancer by 90-95% in women with a BRCA2 mutation.   Males: Breast self-exam training and education starting at age 70 years Annual clinical breast exam starting at age 75 years  Consider annual mammogram starting at age 79 or 10 years before the earliest known male breast cancer in the family (whichever comes first).   Gynecological Cancer Screening/Risk Reduction: It is recommended that women with a BRCA2 mutation have a risk-reducing salpingo oophorectomy (RRSO), removal of the ovaries and fallopian tubes. It is reasonable to delay RRSO until age 90-45 years unless age at diagnosis in the family warrants earlier age for consideration of RRSO.  Having a RRSO is estimated to reduce the risk of ovarian cancer by up to 96%. There is still a small risk of developing an ovarian-like cancer in the lining of the abdomen, called the peritoneum. Another benefit to having the ovaries removed is the risk reduction for breast cancer. If the ovaries are removed before menopause, the risk of developing breast cancer is reduced. Ovarian cancer screening is an option for women who chose not to have a RRSO or who have not yet completed their family. Current screening methods for ovarian cancer are neither sensitive nor specific, meaning that often early stage ovarian cancer cannot be diagnosed through this screening.  Screening can also be falsely positive with no cancer present.  For this reason, RRSO is recommended over screening. If ovarian cancer screening is recommended by a physician, it could include: CA-125 blood tests Transvaginal ultrasounds Clinical pelvic exams   Skin Cancer Screening and Risk Reduction: Regular skin self-examinations Individuals should notify their physicians of any changes to moles such as increasing in size, darkening in color, or other change in appearance. Annual skin examinations by a dermatologist  Follow sun-safety recommendations such as: Using UVA and UVB 30 SPF or higher sunscreen Avoiding sunburns Limiting sun exposure, especially during the hours of 11am-4pm  Wearing protective clothing and sunglasses Avoid using tanning beds For more information about the prevention of melanoma visit [email or web address]   Prostate Cancer Screening: Annual digital rectal exam (DRE) at age 75 Annual PSA blood test at age 23  Pancreatic Cancer Screening/Risk Reduction: Beginning at age 10 years (or 10 years younger than the earliest exocrine pancreatic cancer  diagnosis in the family, whichever is earlier) for individuals with exocrine pancreatic cancer in >=1 first- or second-degree relatives from the same side of (or presumed to be from the same side of)  the family as the identified P/LP germline variant. For individuals considering pancreatic cancer screening, the Panel recommends that screening be performed in experienced high-volume centers. The Panel recommends that such screening only take place after an in-depth discussion about the potential limitations to screening, including cost, the high incidence of benign or indeterminate pancreatic abnormalities, and uncertainties about  the potential benefits of pancreatic cancer screening.  Consider screening using annual contrast-enhanced MRI/magnetic resonance cholangiopancreatography (MRCP) and/or endoscopic ultrasound (EUS), with consideration of shorter screening intervals, based on  clinical judgment, for individuals found to have potentially concerning abnormalities on screening. Studies have typically started screening with contrast-enhanced MRCP and/or EUS in individuals increased risk for pancreatic cancer. The Panel emphasizes that most small cystic lesions found on screening will not warrant biopsy, surgical resection, or any other intervention.  Additional Considerations: Individuals at risk for developing breast and ovarian cancer may benefit from the use of medication to reduce their risk for cancer. These medications are referred to as chemoprevention. For example, oral contraceptive use has been shown to reduce the risk of ovarian cancer by approximately 60% in BRCA2 mutation carriers if taken for at least 5 years. This risk reduction remains even after discontinuation of oral contraceptives. Recent studies have suggested PARP inhibitors may be a beneficial chemotherapeutic agent for a subset of patients with BRCA2-associated breast, ovarian, prostate, and pancreatic cancers. Clinical trials are currently in process to determine if and how these agents can be useful in the treatment of BRCA2 cancer patients Patients of reproductive age should be made aware of options for prenatal diagnosis and assisted reproduction including pre-implantation genetic diagnosis. Individuals with a single pathogenic BRCA2 variant are carriers of Fanconi anemia. Fanconi anemia is characterized by developmental delay apparent from infancy, short stature, microcephaly, and coarse dysmorphic features. For there to be a risk of Fanconi anemia in offspring, both the patient and their partner would each have to carry a pathogenic variant in BRCA2. In this case, the risk of having an affected child is 25%.    This information is based on current understanding of the gene and may change in the future.   Implications for Family Members: Hereditary predisposition to cancer due to pathogenic variants  in the BRCA2 gene has autosomal dominant inheritance. This means that an individual with a pathogenic variant has a 50% chance of passing the condition on to his/her offspring. Identification of a pathogenic variant allows for the recognition of at-risk relatives who can pursue testing for the familial variant.  Family members are encouraged to consider genetic testing for this familial pathogenic variant. As there are generally no childhood cancer risks associated with a single pathogenic variant in the BRCA2 gene, individuals in the family are not recommended to have testing until they reach at least 25 years of age. They may contact our office at [phone] for more information or to schedule an appointment. Complimentary testing for the familial variant is available for 90 days after the genetic testing report date.  Family members who live outside of the area are encouraged to find a genetic counselor in their area by visiting: [email or web address].  Resources: FORCE (Facing Our Risk of Cancer Empowered) is a resource for those with a hereditary predisposition to develop cancer.  FORCE provides information about risk reduction, advocacy, legislation, and clinical trials.  Additionally, FORCE provides a platform for collaboration and support; which includes: peer navigation, message boards, local support groups, a toll-free helpline, research registry and recruitment, advocate training, published medical research, webinars, brochures, mastectomy photos, and more.  For more information, visit www.facingourrisk.org   PLAN: 1. These results will be made available to Melvin Kelly PCP, Dr. Samule Crofts. He would like Dr. Samule Crofts to follow him long-term for this indication.   2. Melvin Kelly plans to discuss these results with her family and will reach out to us  if we  can be of any assistance in coordinating genetic testing for any relatives.   We encouraged Melvin Kelly to remain in  contact with us  on an annual basis so we can update his personal and family histories, and let him know of advances in cancer genetics that may benefit the family. Our contact number was provided. Melvin Kelly questions were answered to his satisfaction today, and he knows he is welcome to call anytime with additional questions.   Valri Gee, MS, [facility] Genetic Counselor [email or web address] Phone: [phone]

## 2024-04-26 NOTE — Telephone Encounter (Signed)
 I contacted Mr. Lasky to discuss his genetic testing results. 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 Detailed clinic note to follow.   The test report has been scanned into EPIC and is located under the Molecular Pathology section of the Results Review tab.  A portion of the result report is included below for reference.      Valri Gee, MS, [facility] Genetic Counselor [email or web address] Phone: [phone]
